# Patient Record
Sex: Male | Born: 1955 | Race: Black or African American | Hispanic: No | Marital: Married | Smoking: Never smoker
Health system: Southern US, Community
[De-identification: ages and names within clinical notes are randomized; demographics above are authoritative.]

## PROBLEM LIST (undated history)

## (undated) DIAGNOSIS — M542 Cervicalgia: Secondary | ICD-10-CM

## (undated) DIAGNOSIS — K219 Gastro-esophageal reflux disease without esophagitis: Secondary | ICD-10-CM

## (undated) DIAGNOSIS — E785 Hyperlipidemia, unspecified: Secondary | ICD-10-CM

## (undated) DIAGNOSIS — I2510 Atherosclerotic heart disease of native coronary artery without angina pectoris: Secondary | ICD-10-CM

## (undated) DIAGNOSIS — Z8669 Personal history of other diseases of the nervous system and sense organs: Secondary | ICD-10-CM

## (undated) DIAGNOSIS — IMO0001 Reserved for inherently not codable concepts without codable children: Secondary | ICD-10-CM

## (undated) DIAGNOSIS — R030 Elevated blood-pressure reading, without diagnosis of hypertension: Secondary | ICD-10-CM

## (undated) HISTORY — DX: Gastro-esophageal reflux disease without esophagitis: K21.9

## (undated) HISTORY — DX: Cervicalgia: M54.2

## (undated) HISTORY — DX: Hyperlipidemia, unspecified: E78.5

## (undated) HISTORY — DX: Reserved for inherently not codable concepts without codable children: IMO0001

## (undated) HISTORY — DX: Atherosclerotic heart disease of native coronary artery without angina pectoris: I25.10

## (undated) HISTORY — DX: Elevated blood-pressure reading, without diagnosis of hypertension: R03.0

## (undated) HISTORY — PX: EYE SURGERY: SHX253

## (undated) HISTORY — DX: Personal history of other diseases of the nervous system and sense organs: Z86.69

---

## 2011-07-02 ENCOUNTER — Encounter (HOSPITAL_COMMUNITY): Payer: Self-pay | Admitting: *Deleted

## 2011-07-02 ENCOUNTER — Emergency Department (HOSPITAL_COMMUNITY)
Admission: EM | Admit: 2011-07-02 | Discharge: 2011-07-02 | Disposition: A | Discharge status: Home / Self Care | Payer: Self-pay | Source: Home / Self Care | Attending: Emergency Medicine | Admitting: Emergency Medicine

## 2011-07-02 DIAGNOSIS — R109 Unspecified abdominal pain: Secondary | ICD-10-CM

## 2011-07-02 DIAGNOSIS — G8929 Other chronic pain: Secondary | ICD-10-CM

## 2011-07-02 LAB — POCT URINALYSIS DIP (DEVICE)
Bilirubin Urine: NEGATIVE
Ketones, ur: NEGATIVE mg/dL
Leukocytes, UA: NEGATIVE
pH: 6 (ref 5.0–8.0)

## 2011-07-02 NOTE — Discharge Instructions (Signed)
  We discussed the nature of your ongoing lower abdominal pain, many diagnoses are possible including colon cancer, prostate cancer and other malignant or benign conditions. As you've been expressing this pain for more than 2 years you've need further evaluation we are coordinating her care to see this for different specialist to screen you for at the most common reasons you might have one of his conditions. In the meantime you should also make every effort to establish [place] over her primary care Dr. your exam today does not suggest there is an emergent need for you to to go to the hospital today we are making every effort to you can see the specialist within the next 2 weeks.    Abdominal Pain Abdominal pain can be caused by many things. Your caregiver decides the seriousness of your pain by an examination and possibly blood tests and X-rays. Many cases can be observed and treated at home. Most abdominal pain is not caused by a disease and will probably improve without treatment. However, in many cases, more time must pass before a clear cause of the pain can be found. Before that point, it may not be known if you need more testing, or if hospitalization or surgery is needed. HOME CARE INSTRUCTIONS   Do not take laxatives unless directed by your caregiver.   Take pain medicine only as directed by your caregiver.   Only take over-the-counter or prescription medicines for pain, discomfort, or fever as directed by your caregiver.   Try a clear liquid diet (broth, tea, or water) for as long as directed by your caregiver. Slowly move to a bland diet as tolerated.  SEEK IMMEDIATE MEDICAL CARE IF:   The pain does not go away.   You have a fever.   You keep throwing up (vomiting).   The pain is felt only in portions of the abdomen. Pain in the right side could possibly be appendicitis. In an adult, pain in the left lower portion of the abdomen could be colitis or diverticulitis.   You pass bloody or  black tarry stools.  MAKE SURE YOU:   Understand these instructions.   Will watch your condition.   Will get help right away if you are not doing well or get worse.  Document Released: 02/03/2005 Document Revised: 01/06/2011 Document Reviewed: 12/13/2007 [facility] Patient Information 2012 [place], [place].

## 2011-07-02 NOTE — ED Provider Notes (Signed)
History     CSN: [id number]  Arrival date & time 07/02/11  1501   First MD Initiated Contact with Patient 07/02/11 1652      Chief Complaint  Patient presents with  . Abdominal Pain    (Consider location/radiation/quality/duration/timing/severity/associated sxs/prior treatment) HPI Comments: Patient presents urgent care tonight with symptomatology that goes back to 2 years and denies having had any previous evaluation and denies having any relationship with any provider.  Patient reports that for about 2 years he has noted this discomfort and pressure in his lower abdomen "bubbling sensation when I eat" (points to periumbilical and lower abdominal region bilaterally. Decided to come in as he's been feeling this more more frequently and seems that he feels his pressure from within his abdomen he points.  Patient denies any weight loss, rectal bleeding, rectal pain, but does describe that his stools have been different but not very specific about which way. Patient also denies any fevers and denies any chronic medical problems. No diarrheas and no vomiting  "it's just pressure down here"   Patient is a 56 y.o. male presenting with abdominal pain. The history is provided by the patient. The history is limited by a language barrier. No language interpreter was used.  Abdominal Pain The primary symptoms of the illness include abdominal pain. The primary symptoms of the illness do not include fever, fatigue, shortness of breath, nausea, vomiting, diarrhea or hematemesis. Episode onset: greater than 2 yeras. The onset of the illness was gradual. The problem has not changed since onset. The patient has had a change in bowel habit. Symptoms associated with the illness do not include chills, anorexia, diaphoresis, constipation, hematuria, frequency or back pain.    History reviewed. No pertinent past medical history.  History reviewed. No pertinent past surgical history.  No family history on  file.  History  Substance Use Topics  . Smoking status: Not on file  . Smokeless tobacco: Not on file  . Alcohol Use: Not on file      Review of Systems  Constitutional: Negative for fever, chills, diaphoresis, activity change, appetite change, fatigue and unexpected weight change.  Respiratory: Negative for shortness of breath.   Cardiovascular: Negative for chest pain.  Gastrointestinal: Positive for abdominal pain. Negative for nausea, vomiting, diarrhea, constipation, anorexia and hematemesis.  Genitourinary: Negative for frequency and hematuria.  Musculoskeletal: Negative for back pain.    Allergies  Review of patient's allergies indicates no known allergies.  Home Medications  No current outpatient prescriptions on file.  There were no vitals taken for this visit.  Physical Exam  Nursing note and vitals reviewed. Constitutional: He appears well-developed and well-nourished. No distress.  HENT:  Head: Normocephalic.  Eyes: Conjunctivae are normal. No scleral icterus.  Neck: Neck supple.  Abdominal: Soft. He exhibits no shifting dullness, no distension and no mass. There is no hepatosplenomegaly, splenomegaly or hepatomegaly. There is tenderness in the epigastric area and periumbilical area. There is no rigidity, no rebound, no guarding, no CVA tenderness, no tenderness at McBurney's point and negative Murphy's sign. No hernia. Hernia confirmed negative in the ventral area.      ED Course  Procedures (including critical care time)   Labs Reviewed  POCT URINALYSIS DIP (DEVICE)   No results found.   1. Chronic abdominal pain       MDM  Patient presented today to urgent care describing nonspecific and vague abdominal symptoms that including indigestion, lower abdominal discomfort, and changes in his bowel habits. Patient  patient speaks limited English, arabic origen. Exam was unremarkable. Given the patient needs as recommended by guidelines for screening  colonoscopy have decided to at this point bypass any other further evaluation and coordinate a screening colonoscopy as well as to discuss potential benefits and risks of having his prostate exam and checked both referrals were originated today as patient has no primary care Dr. I discussed with him that his abdomen did not resemble an acute abdominal condition that require any immediate evaluation or transfer to the emergency department. Patient agreed with plan of care and will also make an effort to establish her primary care doctor in the near future I discussed with him that there are many types of cancer they can manifest with abdominal discomfort including colon cancer and prostate cancer.          Jimmie Molly, MD 07/02/11 [phone]

## 2011-07-02 NOTE — ED Notes (Signed)
Pt    Reports    abd  Pain    With  Sensation  Of       Bubbling     Indigestion      X  2  Years  He  States -  Getting  Worse     -  Pt  Appears in no  Severe  diostess  Animated  And  Walking  With upright fluid  Gait  denys  Any  Vomiting or  Diarrhea  denys  Any  Constipation       Skin is  Warm  Dry -  He  States  In past he  Has  Taken zantac  And  maalox   For  The  Symptoms

## 2011-07-06 NOTE — ED Notes (Signed)
2/22  Medical follow-up request form received from Dr. Ladon Applebaum for pt. to Dr. Elnoria Howard for screening for colonoscopy. #2 Referral to Dr. Annabell Howells for HPP vs Prostate cancer. 2/26 Pt. called this AM and asked about his referrals. I told him I have not worked on them yet but I would call him.      I called and left a message with Morrie Sheldon at [facility]. I called Dr. Haywood Pao office and scheduled appt. for pt. 3/11 @ 1400.  Pt. needs to arrive 15 min. early and call the office before his appt. I called pt. And left a message with appointment information. I told pt. to call if any questions. Chart faxed to [phone] and confirmation received. [name] 07/06/2011

## 2011-07-07 ENCOUNTER — Telehealth (HOSPITAL_COMMUNITY): Payer: Self-pay | Admitting: *Deleted

## 2011-07-07 NOTE — ED Notes (Signed)
2/26 I called pt. and gave pt. appointment information. He said he can't go then because he works 2nd shift. I told him he needs to call the office anyway before his appt. and he can change it when he calls. He said he can't do that and needs me to do that. I told him I would call tomorrow. 2/27 I rescheduled pt.'s appt. for 3/11 @ 1015. I called patient and left this on his VM and told him to call me if any questions. I also called [facility] and he is not one of their patients. I asked if he has the orange card and needs a referral to a specialist who do I call. She said to call [facility] @ [phone] and ask for his case worker. I called [facility] and left message to call me. [name] 07/07/2011

## 2011-07-07 NOTE — ED Notes (Signed)
2/26 @ 1458  Morrie Sheldon from [facility] called on VM and said patients with the orange card have to go through [facility] or [facility]. [name] 07/07/2011

## 2011-07-09 ENCOUNTER — Telehealth (HOSPITAL_COMMUNITY): Payer: Self-pay | Admitting: *Deleted

## 2011-07-09 NOTE — ED Notes (Signed)
2/28  1215 Pt. called back but no message. I called and left a message at [facility] @ [phone].  3/1 Judeth Cornfield called back and said she will send me a referral form. She told me to fill it out and fax it back with his record and she will do the referral to [facility]. She said there is a waiting list and it may be 2-3 mos. before he can be seen. Form filled out and faxed  to [phone] and confirmation recieved. I called pt. back and made sure he understood appt. information for Dr. Elnoria Howard on 3/11. I told him that the Urology appt. is being worked on by [employer] [facility] Care. He said he is going to see Diane on Mon. and she is going to change his PCP. I told him to ask about his Urology referral while he is there. [name] 07/09/2011

## 2011-07-16 ENCOUNTER — Telehealth (HOSPITAL_COMMUNITY): Payer: Self-pay | Admitting: *Deleted

## 2011-07-16 NOTE — ED Notes (Signed)
Pt. called on VM on 3/5 and 3/8 asking about his 2nd appointment with Dr. Annabell Howells at [facility]. I called pt. back and told him I had turned it over to Lattie Corns at [facility] and she was going to get it scheduled. I told him I had called her today and left a message and would call him when I heard about his appt. I told him it might be awhile before he can be seen. I reminded him about his appt. Mon. 3/11 with Dr. Elnoria Howard. Pt. voiced understanding. [name] 07/16/2011

## 2011-07-16 NOTE — ED Notes (Signed)
Stephanie at [facility] called back on my VM and said pt. has been wait-listed. There are 10 referrals ahead of him. She can only do 3-4/month. She can send him to [facility] if he needs to go sooner. Just let her know and she will send the paperwork. [name] 07/16/2011

## 2011-09-02 ENCOUNTER — Emergency Department (HOSPITAL_COMMUNITY)
Admission: EM | Admit: 2011-09-02 | Discharge: 2011-09-02 | Disposition: A | Discharge status: Home / Self Care | Payer: Self-pay | Attending: Emergency Medicine | Admitting: Emergency Medicine

## 2011-09-02 ENCOUNTER — Telehealth: Payer: Self-pay

## 2011-09-02 ENCOUNTER — Encounter (HOSPITAL_COMMUNITY): Payer: Self-pay | Admitting: Radiology

## 2011-09-02 ENCOUNTER — Emergency Department (HOSPITAL_COMMUNITY): Payer: Self-pay

## 2011-09-02 DIAGNOSIS — R1110 Vomiting, unspecified: Secondary | ICD-10-CM | POA: Insufficient documentation

## 2011-09-02 DIAGNOSIS — K5289 Other specified noninfective gastroenteritis and colitis: Secondary | ICD-10-CM | POA: Insufficient documentation

## 2011-09-02 DIAGNOSIS — R197 Diarrhea, unspecified: Secondary | ICD-10-CM | POA: Insufficient documentation

## 2011-09-02 DIAGNOSIS — R079 Chest pain, unspecified: Secondary | ICD-10-CM | POA: Insufficient documentation

## 2011-09-02 DIAGNOSIS — K529 Noninfective gastroenteritis and colitis, unspecified: Secondary | ICD-10-CM

## 2011-09-02 LAB — CBC
HCT: 48.7 % (ref 39.0–52.0)
Hemoglobin: 16.8 g/dL (ref 13.0–17.0)
MCHC: 34.5 g/dL (ref 30.0–36.0)
RBC: 5.5 MIL/uL (ref 4.22–5.81)
WBC: 4.4 10*3/uL (ref 4.0–10.5)

## 2011-09-02 LAB — COMPREHENSIVE METABOLIC PANEL
Alkaline Phosphatase: 92 U/L (ref 39–117)
BUN: 15 mg/dL (ref 6–23)
CO2: 26 mEq/L (ref 19–32)
Chloride: 102 mEq/L (ref 96–112)
Creatinine, Ser: 0.8 mg/dL (ref 0.50–1.35)
GFR calc non Af Amer: >90 mL/min (ref >90)
Total Bilirubin: 1.3 mg/dL — ABNORMAL HIGH (ref 0.3–1.2)

## 2011-09-02 LAB — URINALYSIS, ROUTINE W REFLEX MICROSCOPIC
Hgb urine dipstick: NEGATIVE
Ketones, ur: NEGATIVE mg/dL
Leukocytes, UA: NEGATIVE
Protein, ur: NEGATIVE mg/dL
Urobilinogen, UA: 0.2 mg/dL (ref 0.0–1.0)

## 2011-09-02 LAB — POCT I-STAT TROPONIN I: Troponin i, poc: 0 ng/mL (ref 0.00–0.08)

## 2011-09-02 LAB — DIFFERENTIAL
Lymphocytes Relative: 17 % (ref 12–46)
Lymphs Abs: 0.8 10*3/uL (ref 0.7–4.0)
Monocytes Absolute: 0.4 10*3/uL (ref 0.1–1.0)
Monocytes Relative: 8 % (ref 3–12)
Neutro Abs: 3.2 10*3/uL (ref 1.7–7.7)

## 2011-09-02 MED ORDER — IOHEXOL 300 MG/ML  SOLN
20.0000 mL | INTRAMUSCULAR | Status: AC
  Administered 2011-09-02 (×2): 20 mL via ORAL

## 2011-09-02 MED ORDER — GI COCKTAIL ~~LOC~~
30.0000 mL | Freq: Once | ORAL | Status: AC
  Administered 2011-09-02: 30 mL via ORAL
  Filled 2011-09-02: qty 30

## 2011-09-02 MED ORDER — DICYCLOMINE HCL 20 MG PO TABS: 20.0000 mg | ORAL_TABLET | Freq: Two times a day (BID) | ORAL | Status: DC

## 2011-09-02 MED ORDER — SODIUM CHLORIDE 0.9 % IV BOLUS (SEPSIS)
500.0000 mL | Freq: Once | INTRAVENOUS | Status: AC
  Administered 2011-09-02: 500 mL via INTRAVENOUS

## 2011-09-02 MED ORDER — IOHEXOL 300 MG/ML  SOLN
85.0000 mL | Freq: Once | INTRAMUSCULAR | Status: AC | PRN
  Administered 2011-09-02: 85 mL via INTRAVENOUS

## 2011-09-02 NOTE — ED Provider Notes (Signed)
Medical screening examination/treatment/procedure(s) were performed by non-physician practitioner and as supervising physician I was immediately available for consultation/collaboration.   Glynn Octave, MD 09/02/11 [phone]

## 2011-09-02 NOTE — ED Notes (Signed)
Patient completed oral contrast notified CT.  Patient pain currently is 5/10 achy middle lower abdomen.

## 2011-09-02 NOTE — Discharge Instructions (Signed)
B.R.A.T. Diet Your doctor has recommended the B.R.A.T. diet for you or your child until the condition improves. This is often used to help control diarrhea and vomiting symptoms. If you or your child can tolerate clear liquids, you may have:  Bananas.   Rice.   Applesauce.   Toast (and other simple starches such as crackers, potatoes, noodles).  Be sure to avoid dairy products, meats, and fatty foods until symptoms are better. Fruit juices such as apple, grape, and prune juice can make diarrhea worse. Avoid these. Continue this diet for 2 days or as instructed by your caregiver. Document Released: 04/26/2005 Document Revised: 04/15/2011 Document Reviewed: 10/13/2006 [facility] Patient Information 2012 [place], [place].Colitis Colitis is inflammation of the colon. Colitis can be a short-term or long-standing (chronic) illness. Crohn's disease and ulcerative colitis are 2 types of colitis which are chronic. They usually require lifelong treatment. CAUSES  There are many different causes of colitis, including:  Viruses.   Germs (bacteria).   Medicine reactions.  SYMPTOMS   Diarrhea.   Intestinal bleeding.   Pain.   Fever.   Throwing up (vomiting).   Tiredness (fatigue).   Weight loss.   Bowel blockage.  DIAGNOSIS  The diagnosis of colitis is based on examination and stool or blood tests. X-rays, CT scan, and colonoscopy may also be needed. TREATMENT  Treatment may include:  Fluids given through the vein (intravenously).   Bowel rest (nothing to eat or drink for a period of time).   Medicine for pain and diarrhea.   Medicines (antibiotics) that kill germs.   Cortisone medicines.   Surgery.  HOME CARE INSTRUCTIONS   Get plenty of rest.   Drink enough water and fluids to keep your urine clear or pale yellow.   Eat a well-balanced diet.   Call your caregiver for follow-up as recommended.  SEEK IMMEDIATE MEDICAL CARE IF:   You develop chills.   You have an  oral temperature above 102 F (38.9 C), not controlled by medicine.   You have extreme weakness, fainting, or dehydration.   You have repeated vomiting.   You develop severe belly (abdominal) pain or are passing bloody or tarry stools.  MAKE SURE YOU:   Understand these instructions.   Will watch your condition.   Will get help right away if you are not doing well or get worse.  Document Released: 06/03/2004 Document Revised: 04/15/2011 Document Reviewed: 08/29/2009 [facility] Patient Information 2012 [place], [place].Colitis Colitis is inflammation of the colon. Colitis can be a short-term or long-standing (chronic) illness. Crohn's disease and ulcerative colitis are 2 types of colitis which are chronic. They usually require lifelong treatment. CAUSES  There are many different causes of colitis, including:  Viruses.   Germs (bacteria).   Medicine reactions.  SYMPTOMS   Diarrhea.   Intestinal bleeding.   Pain.   Fever.   Throwing up (vomiting).   Tiredness (fatigue).   Weight loss.   Bowel blockage.  DIAGNOSIS  The diagnosis of colitis is based on examination and stool or blood tests. X-rays, CT scan, and colonoscopy may also be needed. TREATMENT  Treatment may include:  Fluids given through the vein (intravenously).   Bowel rest (nothing to eat or drink for a period of time).   Medicine for pain and diarrhea.   Medicines (antibiotics) that kill germs.   Cortisone medicines.   Surgery.  HOME CARE INSTRUCTIONS   Get plenty of rest.   Drink enough water and fluids to keep your urine clear or  pale yellow.   Eat a well-balanced diet.   Call your caregiver for follow-up as recommended.  SEEK IMMEDIATE MEDICAL CARE IF:   You develop chills.   You have an oral temperature above 102 F (38.9 C), not controlled by medicine.   You have extreme weakness, fainting, or dehydration.   You have repeated vomiting.   You develop severe belly (abdominal) pain  or are passing bloody or tarry stools.  MAKE SURE YOU:   Understand these instructions.   Will watch your condition.   Will get help right away if you are not doing well or get worse.  Document Released: 06/03/2004 Document Revised: 04/15/2011 Document Reviewed: 08/29/2009 [facility] Patient Information 2012 [place], [place].

## 2011-09-02 NOTE — ED Provider Notes (Signed)
6:42 PM Pt signed out to me by Remi Haggard NP. Imaging indicates inflammatory changes of the terminal ileum and potential inflammatory bowel disease. This is a possibility since patient reports having lower abdominal pain for years. Potentially he could be having a flareup. Denies prior colonoscopies. Will discharge with pain medication and followup with gastroenterology for colonoscopy. Discussed this with patient who voices understanding. Patient does not have a white count or fever therefore do not feel patient would benefit from antibiotics at this time. Abdomen reexamined. Mild suprapubic tenderness. No nausea or vomiting in ED.  Results for orders placed during the hospital encounter of 09/02/11  CBC      Component Value Range   WBC 4.4  4.0 - 10.5 (K/uL)   RBC 5.50  4.22 - 5.81 (MIL/uL)   Hemoglobin 16.8  13.0 - 17.0 (g/dL)   HCT 16.1  09.6 - 04.5 (%)   MCV 88.5  78.0 - 100.0 (fL)   MCH 30.5  26.0 - 34.0 (pg)   MCHC 34.5  30.0 - 36.0 (g/dL)   RDW 40.9  81.1 - 91.4 (%)   Platelets 150  150 - 400 (K/uL)  DIFFERENTIAL      Component Value Range   Neutrophils Relative 71  43 - 77 (%)   Neutro Abs 3.2  1.7 - 7.7 (K/uL)   Lymphocytes Relative 17  12 - 46 (%)   Lymphs Abs 0.8  0.7 - 4.0 (K/uL)   Monocytes Relative 8  3 - 12 (%)   Monocytes Absolute 0.4  0.1 - 1.0 (K/uL)   Eosinophils Relative 3  0 - 5 (%)   Eosinophils Absolute 0.1  0.0 - 0.7 (K/uL)   Basophils Relative 0  0 - 1 (%)   Basophils Absolute 0.0  0.0 - 0.1 (K/uL)  COMPREHENSIVE METABOLIC PANEL      Component Value Range   Sodium 139  135 - 145 (mEq/L)   Potassium 3.8  3.5 - 5.1 (mEq/L)   Chloride 102  96 - 112 (mEq/L)   CO2 26  19 - 32 (mEq/L)   Glucose, Bld 92  70 - 99 (mg/dL)   BUN 15  6 - 23 (mg/dL)   Creatinine, Ser 7.82  0.50 - 1.35 (mg/dL)   Calcium 9.5  8.4 - 95.6 (mg/dL)   Total Protein 7.7  6.0 - 8.3 (g/dL)   Albumin 4.3  3.5 - 5.2 (g/dL)   AST 26  0 - 37 (U/L)   ALT 25  0 - 53 (U/L)   Alkaline  Phosphatase 92  39 - 117 (U/L)   Total Bilirubin 1.3 (*) 0.3 - 1.2 (mg/dL)   GFR calc non Af Amer >90  >90 (mL/min)   GFR calc Af Amer >90  >90 (mL/min)  URINALYSIS, ROUTINE W REFLEX MICROSCOPIC      Component Value Range   Color, Urine YELLOW  YELLOW    APPearance CLEAR  CLEAR    Specific Gravity, Urine 1.015  1.005 - 1.030    pH 5.0  5.0 - 8.0    Glucose, UA NEGATIVE  NEGATIVE (mg/dL)   Hgb urine dipstick NEGATIVE  NEGATIVE    Bilirubin Urine NEGATIVE  NEGATIVE    Ketones, ur NEGATIVE  NEGATIVE (mg/dL)   Protein, ur NEGATIVE  NEGATIVE (mg/dL)   Urobilinogen, UA 0.2  0.0 - 1.0 (mg/dL)   Nitrite NEGATIVE  NEGATIVE    Leukocytes, UA NEGATIVE  NEGATIVE   POCT I-STAT TROPONIN I      Component  Value Range   Troponin i, poc 0.00  0.00 - 0.08 (ng/mL)   Comment 3            Ct Abdomen Pelvis W Contrast  09/02/2011  *RADIOLOGY REPORT*  Clinical Data: Abdominal pain  CT ABDOMEN AND PELVIS WITH CONTRAST  Technique:  Multidetector CT imaging of the abdomen and pelvis was performed following the standard protocol during bolus administration of intravenous contrast.  Contrast: 85mL OMNIPAQUE IOHEXOL 300 MG/ML  SOLN  Comparison: None.  Findings: Normal appendix.  There is suspected to be a mild wall thickening the terminal ileum.  No extraluminal bowel gas.  No abscess formation.  Slight stranding of the fat adjacent to the terminal ileum is present.  Liver, gallbladder, spleen, pancreas, adrenal glands, kidneys are within normal limits.  Bladder is distended.  No free fluid.  No abnormal adenopathy.  IMPRESSION: No evidence of appendicitis.  Inflammatory changes of the terminal ileum.  Consider infection, inflammatory bowel disease.  Original Report Authenticated By: Donavan Burnet, M.D.      Thomasene Lot, PA-C 09/02/11 1845

## 2011-09-02 NOTE — ED Provider Notes (Signed)
History     CSN: [id number]  Arrival date & time 09/02/11  1220   First MD Initiated Contact with Patient 09/02/11 1338      Chief Complaint  Patient presents with  . Abdominal Pain  . Chest Pain    (Consider location/radiation/quality/duration/timing/severity/associated sxs/prior treatment) Patient is a 55 y.o. male presenting with abdominal pain. The history is provided by the patient and the spouse. No language interpreter was used.  Abdominal Pain The primary symptoms of the illness include abdominal pain, vomiting and diarrhea. The primary symptoms of the illness do not include fever, shortness of breath, nausea or dysuria. The current episode started more than 2 days ago. The problem has been gradually worsening.  Symptoms associated with the illness do not include chills, heartburn, constipation, hematuria or back pain. Significant associated medical issues include GERD. Significant associated medical issues do not include inflammatory bowel disease, diabetes or substance abuse.   56 year old male coming in with complaint of abdominal pain in the epigastric area x15 years. Also complains of suprapubic pain x3 years. [facility] which confirms that he has had this pain for a long period of time and was treated with Flagyl and Nexium. Patient states that he does not take the Nexium anymore. States that he did have 4 bouts of diarrhea through the night which made the.the abdominal pain worse. Denies burning with urination denies blood in his urine. Denies testicular or scrotal pain. Notes from [place] showed a restrictive for H. pylori. Pt hasnever seen a GI doctor. Patient does not look toxic.  No past medical history on file.  No past surgical history on file.  No family history on file.  History  Substance Use Topics  . Smoking status: Not on file  . Smokeless tobacco: Not on file  . Alcohol Use: Not on file      Review of Systems    Constitutional: Negative.  Negative for fever and chills.  HENT: Negative.   Eyes: Negative.   Respiratory: Negative.  Negative for chest tightness and shortness of breath.   Cardiovascular: Negative.  Negative for chest pain.  Gastrointestinal: Positive for vomiting, abdominal pain and diarrhea. Negative for heartburn, nausea, constipation, blood in stool, abdominal distention, anal bleeding and rectal pain.  Genitourinary: Negative for dysuria, hematuria, discharge, scrotal swelling and testicular pain.  Musculoskeletal: Negative for back pain.  Neurological: Negative.  Negative for dizziness.  Psychiatric/Behavioral: Negative.   All other systems reviewed and are negative.    Allergies  Review of patient's allergies indicates no known allergies.  Home Medications   Current Outpatient Rx  Name Route Sig Dispense Refill  . IBUPROFEN 200 MG PO TABS Oral Take 200-400 mg by mouth every 6 (six) hours as needed. As needed for occasional headaches.    Marland Kitchen OVER THE COUNTER MEDICATION Oral Take 1 tablet by mouth at bedtime. Allergy medication for runny nose and itching.      BP 132/77  Pulse 83  Temp(Src) 97.3 F (36.3 C) (Oral)  Resp 18  SpO2 98%  Physical Exam  Nursing note and vitals reviewed. Constitutional: He is oriented to person, place, and time. He appears well-developed and well-nourished.  HENT:  Head: Normocephalic.  Eyes: Conjunctivae and EOM are normal. Pupils are equal, round, and reactive to light.  Neck: Normal range of motion. Neck supple.  Cardiovascular: Normal rate.   Pulmonary/Chest: Effort normal.  Abdominal: Soft. Bowel sounds are normal. He exhibits no mass. There is  tenderness. There is no rebound and no guarding.       Suprapubic tenderness and epigastric tenderness  Musculoskeletal: Normal range of motion.  Neurological: He is alert and oriented to person, place, and time.  Skin: Skin is warm and dry.  Psychiatric: He has a normal mood and affect.     ED Course  Procedures (including critical care time)   Labs Reviewed  CBC  DIFFERENTIAL  COMPREHENSIVE METABOLIC PANEL  URINALYSIS, ROUTINE W REFLEX MICROSCOPIC   No results found.   No diagnosis found.    MDM  The patient will be admitted to CDU awaiting his CT of the abdomen with contrast. CBC seamen and troponin are negative. EKG unremarkable. Report given to [facility] on the CDU unit. CT of the abdomen is negative patient will go home on H2 blocker with GI follow up.           Remi Haggard, NP 09/02/11 1538

## 2011-09-02 NOTE — ED Notes (Signed)
Pt c/o suprapubic abdominal pain that started last night, vague description fo pain, states bowel and bladder have been normal. Radiates to his chest. He c/o chest pain for 15 years but the abd pain makes it worse. Denies radiation, SOB, N/V or diaphoresis

## 2011-09-02 NOTE — Telephone Encounter (Signed)
Printed out message and put in patient's chart because patient only has paper chart.

## 2011-09-02 NOTE — Telephone Encounter (Signed)
Chris Young FROM [place] WITH DR Marylen Ponto OFFICE STATES THEY NEED RECORDS FAXED ON PT. PLEASE FAX TO [phone] AND THE PHONE NUMBER IS [phone]

## 2011-09-02 NOTE — ED Notes (Signed)
Patient states abdominal pain points to middle lower abdomen radiating to epigastric.  Abdomen soft slight distended. Airway intact bilateral equal chest rise and fall.  States pain currently 8/10 "it hurts too much"  Resting comfortably on stretcher. Family member at bedside.

## 2011-09-07 NOTE — ED Provider Notes (Signed)
Medical screening examination/treatment/procedure(s) were performed by non-physician practitioner and as supervising physician I was immediately available for consultation/collaboration.  Raeford Razor, MD 09/07/11 1246

## 2011-09-30 ENCOUNTER — Telehealth (HOSPITAL_COMMUNITY): Payer: Self-pay | Admitting: *Deleted

## 2011-09-30 NOTE — ED Notes (Signed)
Fax received from [facility] saying pt does not need to see the urologist unless PSA rises or if rectal exam is abnormal. 5/23 Dr. Ladon Applebaum notified. He said to call pt. and tell him to f/u with his PCP. Chris Young 09/30/2011

## 2011-10-14 ENCOUNTER — Encounter (HOSPITAL_COMMUNITY): Payer: Self-pay | Admitting: Emergency Medicine

## 2011-10-14 ENCOUNTER — Emergency Department (HOSPITAL_COMMUNITY)
Admission: EM | Admit: 2011-10-14 | Discharge: 2011-10-14 | Disposition: A | Discharge status: Home / Self Care | Payer: Self-pay | Source: Home / Self Care

## 2011-10-14 DIAGNOSIS — H269 Unspecified cataract: Secondary | ICD-10-CM

## 2011-10-14 NOTE — Discharge Instructions (Signed)
Thank you for coming in today. YOu have cataracts.  Please follow up with (eye doctor) opthalmology as needed.  Your primary doctor may be able to help you.

## 2011-10-14 NOTE — ED Notes (Signed)
PT HERE WITH RIGHT EYE PRESSURE AND TEMPORAL DISCOMFORT SINCE 1988.STATES HE WAS TOLD X 5 YRS AGO POSS CATARACT AND SENT TO EYE DOCTOR.PT GIVEN BIFOCAL GLASSES BUT STATES PRESSURE IS GETTING WORSE OVER TIME BUT DENIES EYE FLOATERS  OR BLURRY VISION

## 2011-10-14 NOTE — ED Provider Notes (Signed)
Chris Young is a 56 y.o. male who presents to Urgent Care today for decreased vision.  Patient is from the [place].  He notes that since 1988 he has decreased vision.  8 years ago he saw an optometrist who diagnosed him with cataracts.  He notes that his vision has decreased again and he would like treatment at all possible.  He denies any acute change in vision pain fevers or chills.    PMH reviewed. Significant for multiple myeloma diabetes hypertension and renal insufficiency History  Substance Use Topics  . Smoking status: Never Smoker   . Smokeless tobacco: Not on file  . Alcohol Use: No   ROS as above Medications reviewed. No current facility-administered medications for this encounter.   Current Outpatient Prescriptions  Medication Sig Dispense Refill  . dicyclomine (BENTYL) 20 MG tablet Take 1 tablet (20 mg total) by mouth 2 (two) times daily.  40 tablet  0  . ibuprofen (ADVIL,MOTRIN) 200 MG tablet Take 200-400 mg by mouth every 6 (six) hours as needed. As needed for occasional headaches.      Marland Kitchen OVER THE COUNTER MEDICATION Take 1 tablet by mouth at bedtime. Allergy medication for runny nose and itching.        Exam:  BP 142/86  Pulse 60  Temp(Src) 98.3 F (36.8 C) (Oral)  Resp 16  SpO2 98% Gen: Well NAD HEENT: EOMI,  MMM, difficult to appreciate fundus do to glare bilaterally. Pupils are equal round reactive to light.  Lungs: CTABL Nl WOB Heart: RRR no MRG Exts: Non edematous BL  LE, warm and well perfused.   No results found for this or any previous visit (from the past 24 hour(s)). No results found.  Assessment and Plan: 56 y.o. male with decreased vision secondary to cataracts.  Reassured patient. Referred to ophthalmology.     Rodolph Bong, MD 10/14/11 [phone]

## 2011-10-14 NOTE — ED Provider Notes (Signed)
Medical screening examination/treatment/procedure(s) were performed by non-physician practitioner and as supervising physician I was immediately available for consultation/collaboration.  Raynald Blend, MD 10/14/11 1753

## 2011-10-26 ENCOUNTER — Telehealth (HOSPITAL_COMMUNITY): Payer: Self-pay | Admitting: *Deleted

## 2011-10-26 NOTE — ED Notes (Signed)
Pt. called back and I told him what the Urology office said and that Dr. Ladon Applebaum recommends he get a PCP.  Pt.'s English is limited, but he said he went to the ED and they checked him all over. They referred him to Dr. Luciano Cutter.  He saw him and was put on a pill that is helping.  He has a f/u appointment on 7/5. I told him Dr. Luciano Cutter is a specialist for the stomach.  I told him I was glad he finally got in to see the gastroenterologist but he needs a primary care doctor- he called it a "general doctor" and I said yes. He said he has to renew his orange card. I told him to call me back after he gets a PCP and I will call the office and explain what he needs done. Chris Young 10/26/2011

## 2013-06-22 IMAGING — CT CT ABD-PELV W/ CM
2 of 6 series · 17 of 46 positions shown, 19 images · IV contrast (APPLIED)
Comparison: None.

CLINICAL DATA: Abdominal pain

CT ABDOMEN AND PELVIS WITH CONTRAST
TECHNIQUE: Multidetector CT imaging of the abdomen and pelvis was
performed following the standard protocol during bolus
administration of intravenous contrast.
Contrast: 85mL OMNIPAQUE IOHEXOL 300 MG/ML  SOLN

[Series 2: abd/pelv with 5.0 b31f st · axial · 0.70mm/px · z∈[+940,+1296]mm · 14 of 81 slices shown, 16 images]
[im 5/81  soft-tissue]
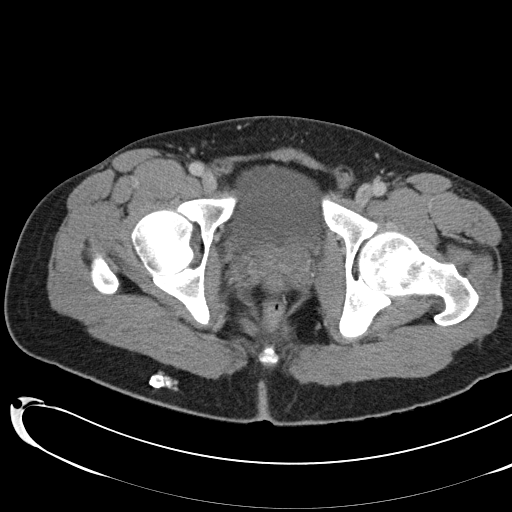
[im 5/81  bone]
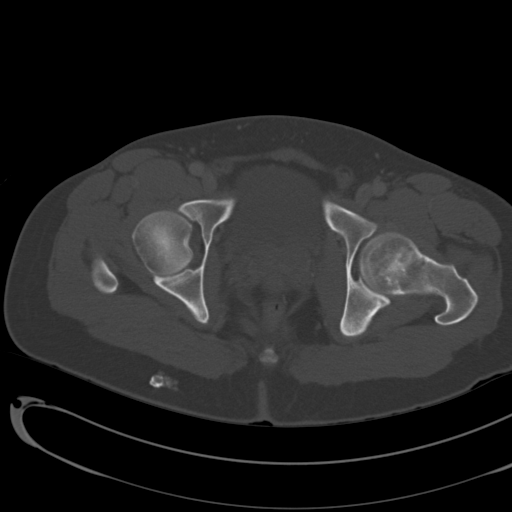
[im 10/81  soft-tissue]
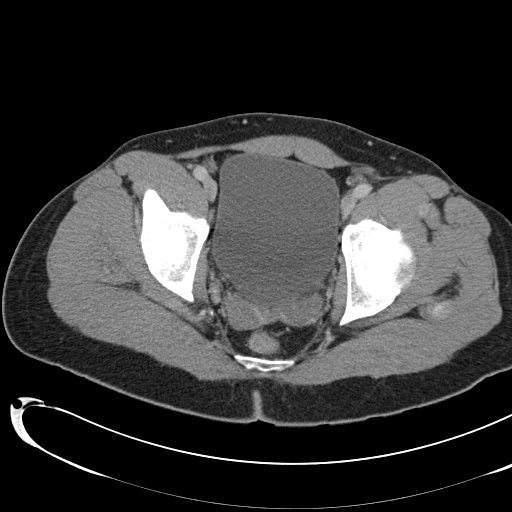
[im 15/81  soft-tissue]
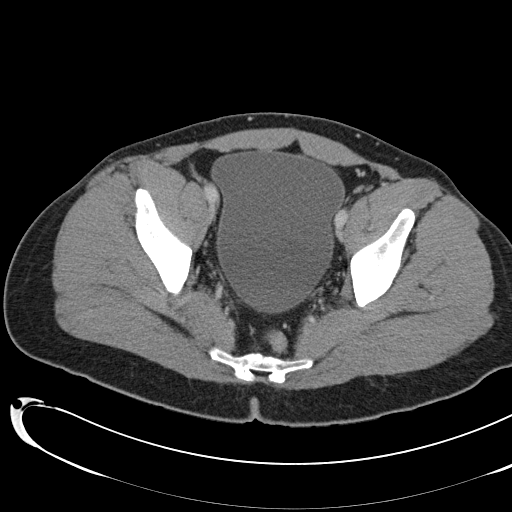
[im 24/81  soft-tissue]
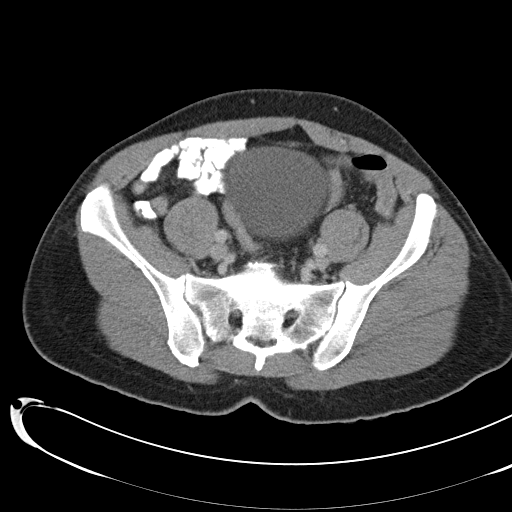
[im 29/81  soft-tissue]
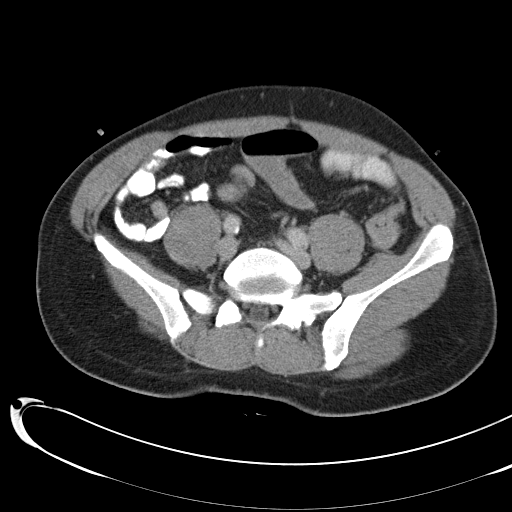
[im 33/81  soft-tissue]
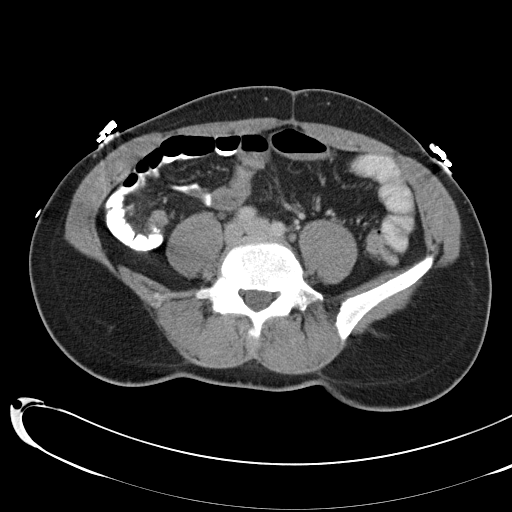
[im 38/81  soft-tissue]
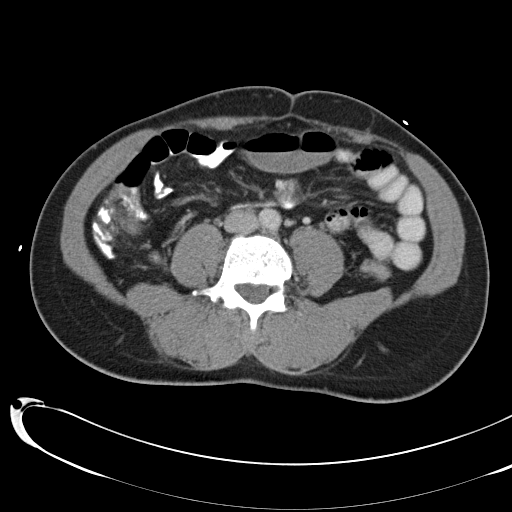
[im 43/81  soft-tissue]
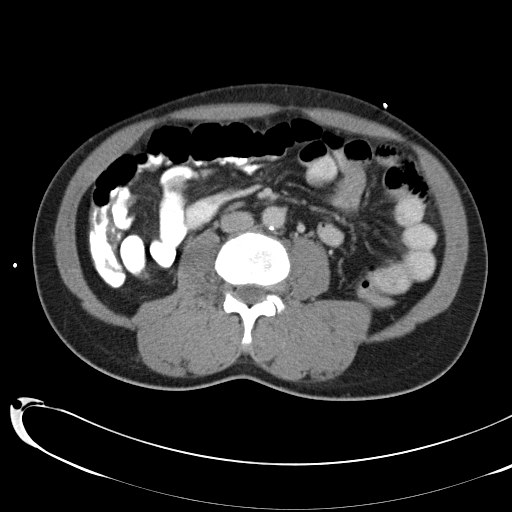
[im 48/81  soft-tissue]
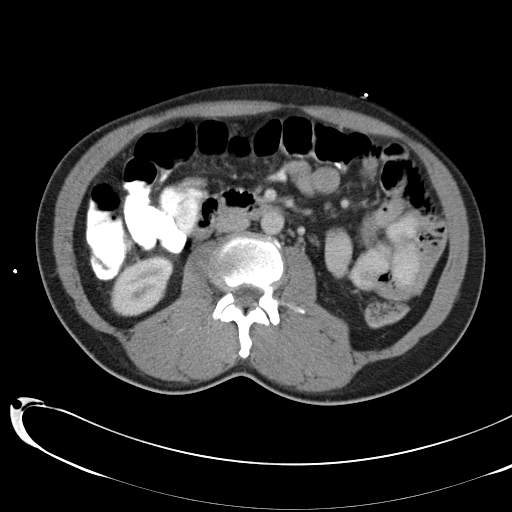
[im 48/81  bone]
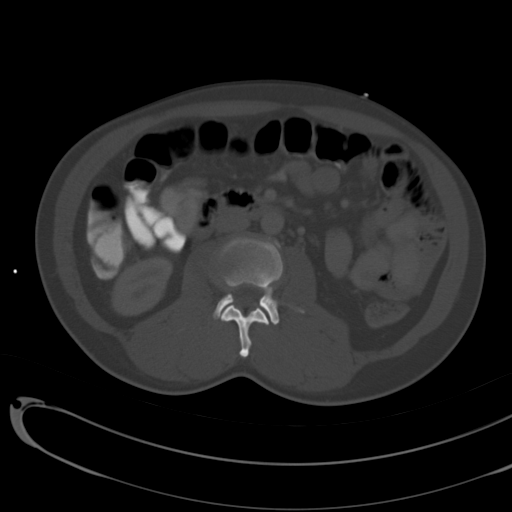
[im 52/81  soft-tissue]
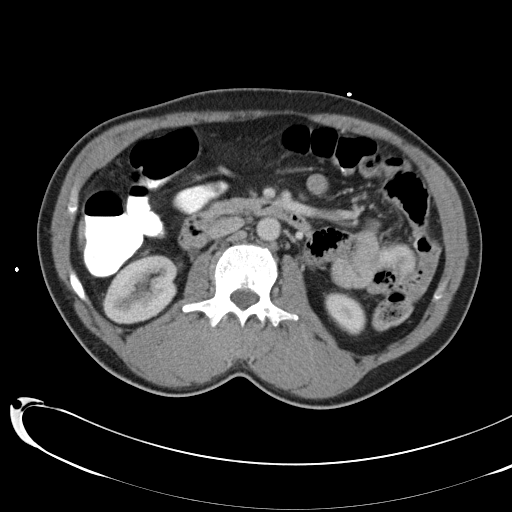
[im 62/81  soft-tissue]
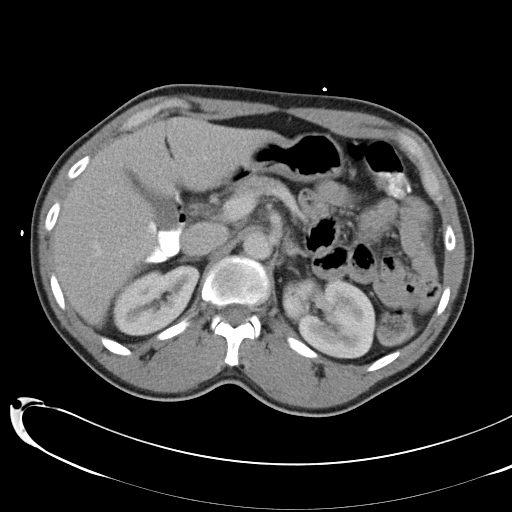
[im 66/81  soft-tissue]
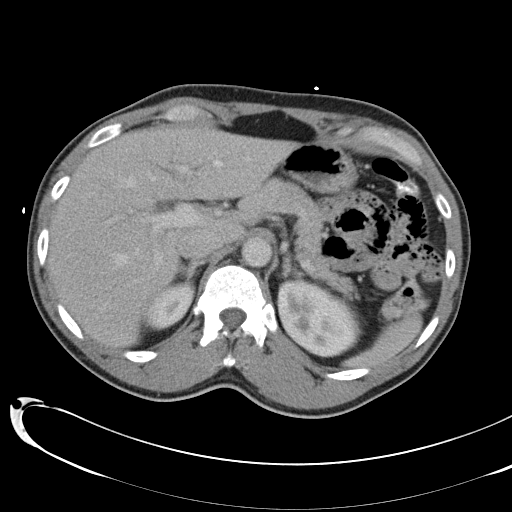
[im 71/81  soft-tissue]
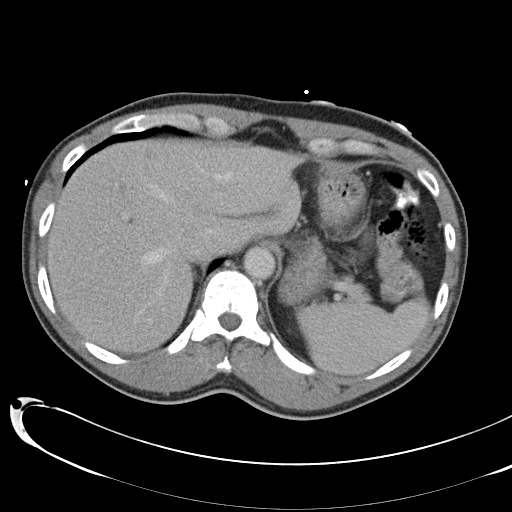
[im 76/81  soft-tissue]
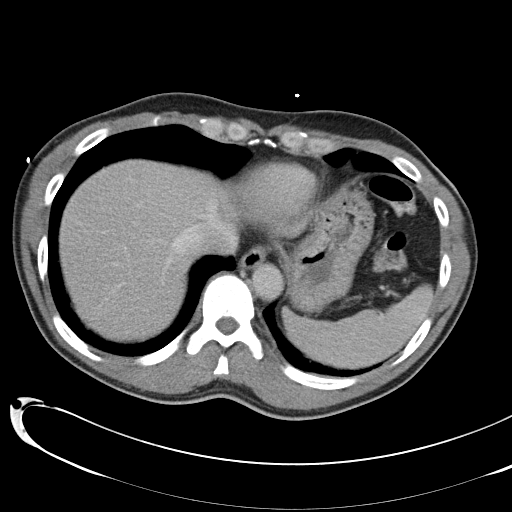

[Series 6: coronals · coronal · 0.90mm/px · 3 of 98 slices shown]
[im 33/98  soft-tissue]
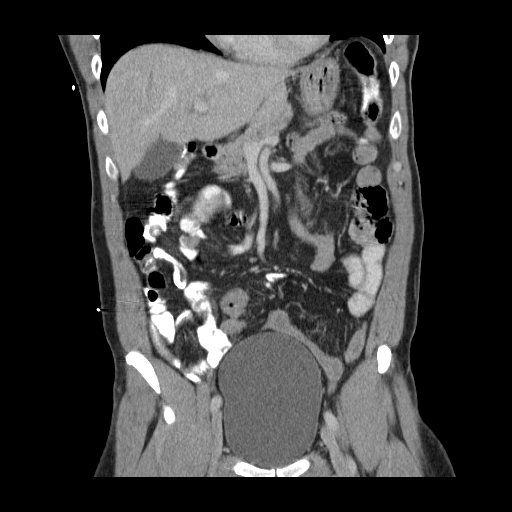
[im 44/98  soft-tissue]
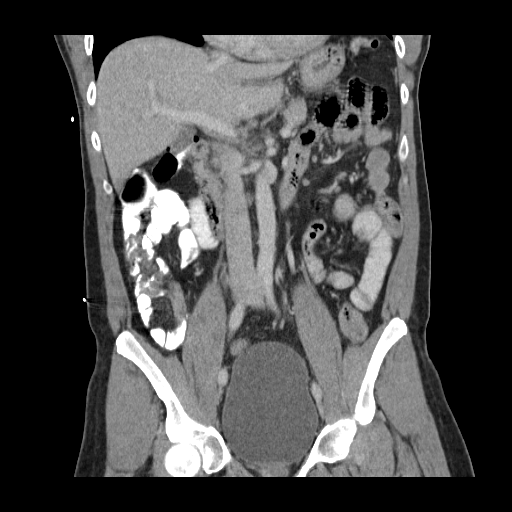
[im 54/98  soft-tissue]
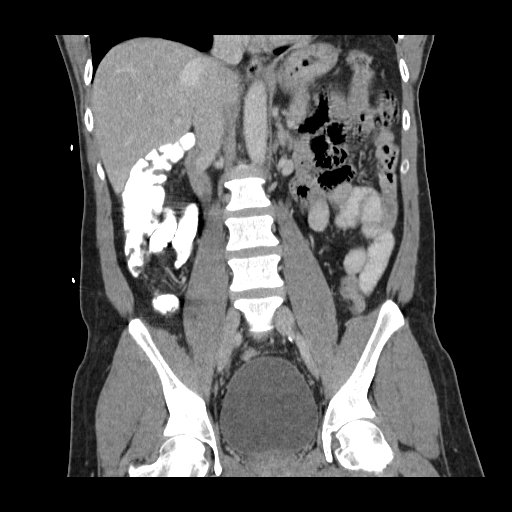

[17 of 46 positions shown; findings below may reference images not displayed]

FINDINGS: Normal appendix.  There is suspected to be a mild wall
thickening the terminal ileum.  No extraluminal bowel gas.  No
abscess formation.  Slight stranding of the fat adjacent to the
terminal ileum is present.

Liver, gallbladder, spleen, pancreas, adrenal glands, kidneys are
within normal limits.

Bladder is distended.  No free fluid.  No abnormal adenopathy.
IMPRESSION: No evidence of appendicitis.

Inflammatory changes of the terminal ileum.  Consider infection,
inflammatory bowel disease.

## 2014-07-18 ENCOUNTER — Encounter (HOSPITAL_COMMUNITY): Payer: Self-pay | Admitting: Physical Medicine and Rehabilitation

## 2014-07-18 ENCOUNTER — Emergency Department (HOSPITAL_COMMUNITY)
Admission: EM | Admit: 2014-07-18 | Discharge: 2014-07-18 | Disposition: A | Discharge status: Home / Self Care | Payer: 59 | Attending: Emergency Medicine | Admitting: Emergency Medicine

## 2014-07-18 ENCOUNTER — Emergency Department (HOSPITAL_COMMUNITY): Payer: 59

## 2014-07-18 DIAGNOSIS — R05 Cough: Secondary | ICD-10-CM | POA: Diagnosis not present

## 2014-07-18 DIAGNOSIS — M79641 Pain in right hand: Secondary | ICD-10-CM

## 2014-07-18 DIAGNOSIS — G8929 Other chronic pain: Secondary | ICD-10-CM

## 2014-07-18 DIAGNOSIS — Z79899 Other long term (current) drug therapy: Secondary | ICD-10-CM | POA: Diagnosis not present

## 2014-07-18 DIAGNOSIS — J029 Acute pharyngitis, unspecified: Secondary | ICD-10-CM | POA: Diagnosis present

## 2014-07-18 DIAGNOSIS — M542 Cervicalgia: Secondary | ICD-10-CM | POA: Diagnosis not present

## 2014-07-18 DIAGNOSIS — R059 Cough, unspecified: Secondary | ICD-10-CM

## 2014-07-18 MED ORDER — GI COCKTAIL ~~LOC~~
30.0000 mL | Freq: Once | ORAL | Status: AC
  Administered 2014-07-18: 30 mL via ORAL
  Filled 2014-07-18: qty 30

## 2014-07-18 MED ORDER — ACETAMINOPHEN 500 MG PO TABS
1000.0000 mg | ORAL_TABLET | Freq: Once | ORAL | Status: AC
  Administered 2014-07-18: 1000 mg via ORAL
  Filled 2014-07-18: qty 2

## 2014-07-18 MED ORDER — BENZONATATE 100 MG PO CAPS: 100.0000 mg | ORAL_CAPSULE | Freq: Three times a day (TID) | ORAL | Status: DC

## 2014-07-18 MED ORDER — ACETAMINOPHEN 500 MG PO TABS: 500.0000 mg | ORAL_TABLET | Freq: Four times a day (QID) | ORAL | Status: DC | PRN

## 2014-07-18 NOTE — Discharge Instructions (Signed)
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  Please follow up with Dr. Julio Sicks with Neurosurgery for this. 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  Take Medicine as prescribed and follow up as appropriate.    Cervical Sprain A cervical sprain is an injury in the neck in which the strong, fibrous tissues (ligaments) that connect your neck bones stretch or tear. Cervical sprains can range from mild to severe. Severe cervical sprains can cause the neck vertebrae to be unstable. This can lead to damage of the spinal cord and can result in serious nervous system problems. The amount of time it takes for a cervical sprain to get better depends on the cause and extent of the injury. Most cervical sprains heal in 1 to 3 weeks. CAUSES  Severe cervical sprains may be caused by:   Contact sport injuries (such as from football, rugby, wrestling, hockey, auto racing, gymnastics, diving, martial arts, or boxing).   Motor vehicle collisions.   Whiplash injuries. This is an injury from a sudden forward and backward whipping movement of the head and neck.  Falls.  Mild cervical sprains may be caused by:   Being in an awkward position, such as while cradling a telephone between your ear and shoulder.   Sitting in a chair that does not offer proper support.   Working at a poorly Marketing executive station.   Looking up or down for long periods of time.  SYMPTOMS   Pain, soreness, stiffness, or a burning sensation in the front, back, or sides of the neck. This discomfort may develop immediately after the injury or slowly, 24 hours or more after the injury.   Pain or tenderness directly in the middle of the back of the neck.   Shoulder or upper back pain.   Limited ability to move the neck.   Headache.   Dizziness.   Weakness, numbness, or  tingling in the hands or arms.   Muscle spasms.   Difficulty swallowing or chewing.   Tenderness and swelling of the neck.  DIAGNOSIS  Most of the time your health care provider can diagnose a cervical sprain by taking your history and doing a physical exam. Your health care provider will ask about previous neck injuries and any known neck problems, such as arthritis in the neck. X-rays may be taken to find out if there are any other problems, such as with the bones of the neck. Other tests, such as a CT scan or MRI, may also be needed.  TREATMENT  Treatment depends on the severity of the cervical sprain. Mild sprains can be treated with rest, keeping the neck in place (immobilization), and pain medicines. Severe cervical sprains are immediately immobilized. Further treatment is done to help with pain, muscle spasms, and other symptoms and may include:  Medicines, such as pain relievers, numbing medicines, or muscle relaxants.   Physical therapy. This may involve stretching exercises, strengthening exercises, and posture training. Exercises and improved posture can help stabilize the neck, strengthen muscles, and help stop symptoms from returning.  HOME CARE INSTRUCTIONS   Put ice on the injured area.   Put ice in a plastic bag.   Place a towel between your skin and the bag.   Leave the ice on for 15-20 minutes, 3-4 times a day.   If your injury was severe, you may have been given  a cervical collar to wear. A cervical collar is a two-piece collar designed to keep your neck from moving while it heals.  Do not remove the collar unless instructed by your health care provider.  If you have long hair, keep it outside of the collar.  Ask your health care provider before making any adjustments to your collar. Minor adjustments may be required over time to improve comfort and reduce pressure on your chin or on the back of your head.  Ifyou are allowed to remove the collar for  cleaning or bathing, follow your health care provider's instructions on how to do so safely.  Keep your collar clean by wiping it with mild soap and water and drying it completely. If the collar you have been given includes removable pads, remove them every 1-2 days and hand wash them with soap and water. Allow them to air dry. They should be completely dry before you wear them in the collar.  If you are allowed to remove the collar for cleaning and bathing, wash and dry the skin of your neck. Check your skin for irritation or sores. If you see any, tell your health care provider.  Do not drive while wearing the collar.   Only take over-the-counter or prescription medicines for pain, discomfort, or fever as directed by your health care provider.   Keep all follow-up appointments as directed by your health care provider.   Keep all physical therapy appointments as directed by your health care provider.   Make any needed adjustments to your workstation to promote good posture.   Avoid positions and activities that make your symptoms worse.   Warm up and stretch before being active to help prevent problems.  SEEK MEDICAL CARE IF:   Your pain is not controlled with medicine.   You are unable to decrease your pain medicine over time as planned.   Your activity level is not improving as expected.  SEEK IMMEDIATE MEDICAL CARE IF:   You develop any bleeding.  You develop stomach upset.  You have signs of an allergic reaction to your medicine.   Your symptoms get worse.   You develop new, unexplained symptoms.   You have numbness, tingling, weakness, or paralysis in any part of your body.  MAKE SURE YOU:   Understand these instructions.  Will watch your condition.  Will get help right away if you are not doing well or get worse. Document Released: 02/21/2007 Document Revised: 05/01/2013 Document Reviewed: 11/01/2012 [facility] Patient Information 2015 [place],  [place]. This information is not intended to replace advice given to you by your health care provider. Make sure you discuss any questions you have with your health care provider.  Upper Respiratory Infection, Adult An upper respiratory infection (URI) is also sometimes known as the common cold. The upper respiratory tract includes the nose, sinuses, throat, trachea, and bronchi. Bronchi are the airways leading to the lungs. Most people improve within 1 week, but symptoms can last up to 2 weeks. A residual cough may last even longer.  CAUSES Many different viruses can infect the tissues lining the upper respiratory tract. The tissues become irritated and inflamed and often become very moist. Mucus production is also common. A cold is contagious. You can easily spread the virus to others by oral contact. This includes kissing, sharing a glass, coughing, or sneezing. Touching your mouth or nose and then touching a surface, which is then touched by another person, can also spread the virus. SYMPTOMS  Symptoms  typically develop 1 to 3 days after you come in contact with a cold virus. Symptoms vary from person to person. They may include:  Runny nose.  Sneezing.  Nasal congestion.  Sinus irritation.  Sore throat.  Loss of voice (laryngitis).  Cough.  Fatigue.  Muscle aches.  Loss of appetite.  Headache.  Low-grade fever. DIAGNOSIS  You might diagnose your own cold based on familiar symptoms, since most people get a cold 2 to 3 times a year. Your caregiver can confirm this based on your exam. Most importantly, your caregiver can check that your symptoms are not due to another disease such as strep throat, sinusitis, pneumonia, asthma, or epiglottitis. Blood tests, throat tests, and X-rays are not necessary to diagnose a common cold, but they may sometimes be helpful in excluding other more serious diseases. Your caregiver will decide if any further tests are required. RISKS AND COMPLICATIONS    You may be at risk for a more severe case of the common cold if you smoke cigarettes, have chronic heart disease (such as heart failure) or lung disease (such as asthma), or if you have a weakened immune system. The very young and very old are also at risk for more serious infections. Bacterial sinusitis, middle ear infections, and bacterial pneumonia can complicate the common cold. The common cold can worsen asthma and chronic obstructive pulmonary disease (COPD). Sometimes, these complications can require emergency medical care and may be life-threatening. PREVENTION  The best way to protect against getting a cold is to practice good hygiene. Avoid oral or hand contact with people with cold symptoms. Wash your hands often if contact occurs. There is no clear evidence that vitamin C, vitamin E, echinacea, or exercise reduces the chance of developing a cold. However, it is always recommended to get plenty of rest and practice good nutrition. TREATMENT  Treatment is directed at relieving symptoms. There is no cure. Antibiotics are not effective, because the infection is caused by a virus, not by bacteria. Treatment may include:  Increased fluid intake. Sports drinks offer valuable electrolytes, sugars, and fluids.  Breathing heated mist or steam (vaporizer or shower).  Eating chicken soup or other clear broths, and maintaining good nutrition.  Getting plenty of rest.  Using gargles or lozenges for comfort.  Controlling fevers with ibuprofen or acetaminophen as directed by your caregiver.  Increasing usage of your inhaler if you have asthma. Zinc gel and zinc lozenges, taken in the first 24 hours of the common cold, can shorten the duration and lessen the severity of symptoms. Pain medicines may help with fever, muscle aches, and throat pain. A variety of non-prescription medicines are available to treat congestion and runny nose. Your caregiver can make recommendations and may suggest nasal or  lung inhalers for other symptoms.  HOME CARE INSTRUCTIONS   Only take over-the-counter or prescription medicines for pain, discomfort, or fever as directed by your caregiver.  Use a warm mist humidifier or inhale steam from a shower to increase air moisture. This may keep secretions moist and make it easier to breathe.  Drink enough water and fluids to keep your urine clear or pale yellow.  Rest as needed.  Return to work when your temperature has returned to normal or as your caregiver advises. You may need to stay home longer to avoid infecting others. You can also use a face mask and careful hand washing to prevent spread of the virus. SEEK MEDICAL CARE IF:   After the first few days,  you feel you are getting worse rather than better.  You need your caregiver's advice about medicines to control symptoms.  You develop chills, worsening shortness of breath, or brown or red sputum. These may be signs of pneumonia.  You develop yellow or brown nasal discharge or pain in the face, especially when you bend forward. These may be signs of sinusitis.  You develop a fever, swollen neck glands, pain with swallowing, or white areas in the back of your throat. These may be signs of strep throat. SEEK IMMEDIATE MEDICAL CARE IF:   You have a fever.  You develop severe or persistent headache, ear pain, sinus pain, or chest pain.  You develop wheezing, a prolonged cough, cough up blood, or have a change in your usual mucus (if you have chronic lung disease).  You develop sore muscles or a stiff neck. Document Released: 10/20/2000 Document Revised: 07/19/2011 Document Reviewed: 08/01/2013 [facility] Patient Information 2015 [place], [place]. This information is not intended to replace advice given to you by your health care provider. Make sure you discuss any questions you have with your health care provider.   Emergency Department Resource Guide 1) Find a Doctor and Pay Out of Pocket Although  you won't have to find out who is covered by your insurance plan, it is a good idea to ask around and get recommendations. You will then need to call the office and see if the doctor you have chosen will accept you as a new patient and what types of options they offer for patients who are self-pay. Some doctors offer discounts or will set up payment plans for their patients who do not have insurance, but you will need to ask so you aren't surprised when you get to your appointment.  2) Contact Your Local Health Department Not all health departments have doctors that can see patients for sick visits, but many do, so it is worth a call to see if yours does. If you don't know where your local health department is, you can check in your phone book. The CDC also has a tool to help you locate your state's health department, and many state websites also have listings of all of their local health departments.  3) Find a Walk-in Clinic If your illness is not likely to be very severe or complicated, you may want to try a walk in clinic. These are popping up all over the country in pharmacies, drugstores, and shopping centers. They're usually staffed by nurse practitioners or physician assistants that have been trained to treat common illnesses and complaints. They're usually fairly quick and inexpensive. However, if you have serious medical issues or chronic medical problems, these are probably not your best option.  No Primary Care Doctor: - Call Health Connect at  [phone] - they can help you locate a primary care doctor that  accepts your insurance, provides certain services, etc. - Physician Referral Service- [phone]  Chronic Pain Problems: Organization         Address  Phone   Notes  [facility]  [phone] Patients need to be referred by their primary care doctor.   Medication Assistance: Organization         Address  Phone   Notes  [facility] [address] [place] [place], [place] [address] [phone] --Must be a resident of [facility] -- Must have NO insurance coverage whatsoever (no Medicaid/ Medicare, etc.) -- The pt. MUST have a  primary care doctor that directs their care regularly and follows them in the community   [facility]  [phone]   [employer]  [phone]    Agencies that provide inexpensive medical care: Organization         Address  Phone   Notes  [facility]  [phone]   [facility]    [phone]   [facility] [address] [place], [place] [address] [phone]   [facility] [address] [place] [phone]   Planned Parenthood    [phone]   [facility]    [phone]   [facility] and [facility]  [address], [place] Phone:  [phone], Fax:  [phone] Hours of Operation:  9 am - 6 pm, M-F.  Also accepts Medicaid/Medicare and self-pay.  [facility]  [address], [place] Phone: [phone], Fax: [phone]. Hours of Operation:  8:30 am - 5:30 pm, M-F.  Also accepts Medicaid and self-pay.  [facility] [place] [address], [place] Phone: [phone]   [facility] [address] Natasha Bence [place], [place] [phone], Ext. [phone] Mondays & Thursdays: 7-9 AM.  First 15 patients are seen on a first come, first serve basis.    Medicaid-accepting [facility] Providers:  Organization         Address  Phone   Notes  [facility] [address], [place] [phone] Also accepts self-pay patients.  [facility] [address] Laurell Josephs [place], [place]  [phone]   [facility] [address], [place] [phone]   [facility] [address], [place] [phone]   Renaye Rakers [address], [place]   [phone] Only accepts [place] Access [place] patients after they have their name applied to their card.   Self-Pay (no insurance) in [facility]:  Organization         Address  Phone   Notes  Sickle Cell Patients, [facility] [address] [place], [place] [phone]   [facility] [address] [place], [place] [phone]   [facility] [place]  [address], [place] [phone]   [facility]/Dr. Osei-Bonsu  [address], [place] or [address], [place] [phone] Phone number for both [place] and [place] locations is the same.  [facility] and [facility] [address] [place] [phone]   [facility] [address] [place] or [address] [phone]   [facility] [address] [place] [phone], phone; [phone], fax Sees patients 1st and 3rd Saturday of every month.  Must not qualify for public or private insurance (i.e. Medicaid, Medicare, [place] Health Choice, Veterans' Benefits)  Household income should be no more than 200% of the poverty level The clinic cannot treat you if you are pregnant or think you are pregnant  Sexually transmitted diseases are not treated at the clinic.    Dental Care: Organization         Address  Phone  Notes  [facility] Department of [facility] [address] [place], [place] [phone]  Accepts children up to age 51 who are enrolled in Medicaid or [place] Health Choice; pregnant women with a Medicaid card; and children who have applied for Medicaid or [place] Health Choice, but were declined, whose parents can pay a reduced fee at time of service.  [facility] Department of [facility]  [address], [place] [phone] Accepts  children up to age 31 who are enrolled in [place] or [place] Health Choice; pregnant women with a Medicaid card; and children who have applied for Medicaid or [place] Health Choice, but were declined, whose parents can pay a reduced fee at time of service.  [facility]  [address] [place], [place] [phone] Patients are seen by appointment only. Walk-ins are not accepted. [facility] will see patients 82 years of age and older. Monday - Tuesday (8am-5pm) Most Wednesdays (8:30-5pm) $30 per visit, cash only  [facility] Adult Dental Access PRO[facility]  [address], [facility] [phone] Patients are seen by appointment only. Walk-ins are not accepted. [facility] will see patients 106 years of age and older. One Wednesday Evening (Monthly: Volunteer Based).  $30 per visit, cash only  [employer]  [phone] for adults; Children under age 30, call Graduate Pediatric Dentistry at [phone]. Children aged 40-14, please call [phone] to request a pediatric application.  Dental services are provided in all areas of dental care including fillings, crowns and bridges, complete and partial dentures, implants, gum treatment, root canals, and extractions. Preventive care is also provided. Treatment is provided to both adults and children. Patients are selected via a lottery and there is often a waiting list.   [facility] [address], [place]  [phone] [email or web address]   [facility] [address] [place], [place] [phone], Ext. [phone] Second and Fourth Thursday of each month, opens at 6:30 AM; Clinic ends at 9 AM.  Patients are seen on a first-come first-served basis, and a limited number are seen during each clinic.   [facility]  [address] Griffins [place], [place] [phone]   Eligibility Requirements You must have lived in [place], [place], or [place] counties for at least the  last three months.   You cannot be eligible for state or federal sponsored [place], including [employer], [place], or [employer].   You generally cannot be eligible for healthcare insurance through your employer.    How to apply: Eligibility screenings are held every Tuesday and Wednesday afternoon from 1:00 pm until 4:00 pm. You do not need an appointment for the interview!  [facility] [address], [place], [place] [phone]   [facility] Health Department  [phone]   [facility] Health Department  [phone]   [facility] Health Department  [phone]    Behavioral Health Resources in the Community: Intensive Outpatient Programs Organization         Address  Phone  Notes  [facility] [address], [place], [place] [phone]   [facility] [address] [place], [place] [phone]   [facility] [address] [place], [place]  [phone]   [facility] Mental Health [address],  [place], [place] [phone] or [phone]   Substance Abuse Resources Organization         Address  Phone  Notes  [facility]  [phone]  [facility]  [phone]   [facility]  [phone]   Floydene Flock  [phone]   Residential & Outpatient Substance Abuse Program  [phone]   Psychological Services Organization         Address  Phone  Notes  [facility] Health  [phone]   [facility]  [phone]   [facility] [address] [place] [phone] or [phone]    Mobile Crisis Teams Organization         Address  Phone  Notes  [facility], Mobile Crisis Care Unit  [phone]   [facility]  [address] [place], [place] [phone]   Doristine Locks [address] [place] [phone]     Self-Help/Support Groups Organization         Address  Phone             Notes  [facility] [place] - variety of support groups  336- I7437963 Call for more information  Narcotics Anonymous (NA), [facility] [address], [employer] [place]  2 meetings at this location   Statistician         Address  Phone  Notes  [facility] [address],    [place]  [phone]   [facility]  [address] [place] [address], [place], [place] [phone]   [facility] [address] [place], [place] [phone] Admissions: 8am-3pm M-F  [facility] [address],    [place], [place] [phone]   [facility] [address] [place], [place], [place] [phone]   [facility] [address],  [place], [place] [phone]   [facility] - Intensive Outpatient [address]urell Joseph[address], [place], [place] [phone]   [facility] (Addiction [facility] [address] [place].,  [place], [place] [phone] or [phone]   [facility] ([facility]) [address] [place], [place] [phone] Accepts Medicaid  [facility] [address],  [place] [phone] Substance Abuse/Addiction Treatment   [facility] Organization         Address  Phone  Notes  [facility]  [phone]   Angie Fava, PhD [address] Ervin Knack [place], [place]   [phone] or [phone]   [facility] Behavioral   [address] [place], [place] [phone]   [facility] [address] [place], [place] [phone] Insurance/Medicaid/sponsorship through [facility] and Families [address]                                    [place], [place] [phone] Therapy/tele-psych/case  [facility] [address], [place] [phone]    Dr. Lolly Mustache  [phone]   [facility] [place]  [employer] [facility] Dept. 1) [address], [place] 2) [address] [place] 3)  [address], [place] [phone]  [phone]   [facility] Child Abuse Hotline 3404513272 or 6196571100 (After Hours)

## 2014-07-18 NOTE — ED Provider Notes (Signed)
  Physical Exam  BP 133/81 mmHg  Pulse 71  Temp(Src) 100.1 F (37.8 C) (Oral)  Resp 12  SpO2 100%  Physical Exam  Constitutional: He is oriented to person, place, and time. He appears well-developed and well-nourished. No distress.  HENT:  Head: Normocephalic and atraumatic.  Eyes: Right eye exhibits no discharge. Left eye exhibits no discharge. No scleral icterus.  Neck: Normal range of motion.  Pulmonary/Chest: Effort normal. No respiratory distress.  Musculoskeletal: Normal range of motion.  Neurological: He is alert and oriented to person, place, and time. He has normal strength. No cranial nerve deficit or sensory deficit. He displays a negative Romberg sign. GCS eye subscore is 4. GCS verbal subscore is 5. GCS motor subscore is 6.  Patient fully alert, answering questions appropriately in full, clear sentences. Cranial nerve II through XII grossly intact. Motor strength 5 out of 5 in all major muscle groups of upper and lower extremities. Distal sensation intact.  Skin: Skin is warm and dry. He is not diaphoretic.  Psychiatric: He has a normal mood and affect.  Nursing note and vitals reviewed.   ED Course  Procedures  MDM Patient signed out to me by Langston MaskerKaren Sofia, PA-C with plan to follow-up on patient's radiographs, likely patient is experiencing an upper respiratory infection as well as having some chronic complaints. Based on patient's findings below along with clinical findings, there is no concern for any acute process going on, patient appears to have multiple complaints which are ongoing and will most benefit from appropriate follow-up with outpatient resources. Patient noted to have fever in the ER, patient given Tylenol which improved his fever. On my examination patient does not have any signs or symptoms consistent with any bacterial infection which would require antibiotic therapy at this time.  Chest x-ray unremarkable for acute pathology. No pneumonia.  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 No acute osseous findings or malalignment.  Correlated with patient's physical exam and clinical picture, likely this is a degenerative process, low suspicion for acute injury with this, although patient has been experiencing and neck pain for 9 months status post fall. Patient has no neurologic deficit, and does not describe any radicular symptoms, or symptoms that are concerning for a spinal compression. We'll refer patient to neurosurgery for follow-up on these findings and this pain.  Hand radiographs remarkable for impression: There is no evidence of fracture or dislocation. There is no evidence of arthropathy or other focal bone abnormality. 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  Based on the fact that patient's hand pain is intermittent, and he is asymptomatic in the ER, doubt any acute pathology related to this finding. We will have patient follow-up with primary care physician for this finding. Patient referred to community health and wellness clinic. We discussed return precautions with patient, and encourage him to call or return to the ER with any worsening symptoms or questions or concerns. Patient verbalizes understanding and agreement of this plan.  BP 133/81 mmHg  Pulse 71  Temp(Src) 100.1 F (37.8 C) (Oral)  Resp 12  SpO2 100%  Signed,  Ladona MowJoe Zaiya Annunziato, PA-C 7:15 PM    Ladona MowJoe Gerritt Galentine, PA-C 07/18/14 1915  Tilden FossaElizabeth Rees, MD 07/18/14 2252

## 2014-07-18 NOTE — ED Notes (Signed)
Pt presents to department for evaluation of cough, sore throat and generalized pain all over body. Pt states "I've had pain all over my body for many years." pt is alert and oriented x4. NAD.

## 2014-07-18 NOTE — ED Notes (Signed)
Patient transported to X-ray 

## 2014-07-18 NOTE — ED Notes (Signed)
PA aware of pts temp done at discharge. Tylenol ordered, temp to be rechecked before d/c patient.

## 2014-07-18 NOTE — ED Provider Notes (Signed)
CSN: [id number]     Arrival date & time 07/18/14  1327 History  This chart was scribed for non-physician practitioner, Langston MaskerKaren Raymir Frommelt, PA-C, working with Toy CookeyMegan Docherty, MD by Charline BillsEssence Howell, ED Scribe. This patient was seen in room TR07C/TR07C and the patient's care was started at 3:14 PM.   Chief Complaint  Patient presents with  . Pain  . Cough  . Sore Throat   The history is provided by the patient. No language interpreter was used.   HPI Comments: Chris Young is a 59 y.o. male, with no pertinent medical history, who presents to the Emergency Department complaining of persistent dry cough for the past week. He reports associated unchanged, burning, substernal chest pain for 3 years. Pt was seen by gastroenterologist Dr. Elnoria HowardHung last year and had an upper and lower endoscopies done. Pt was started on Prilosec which he suspects is depleting his calcium levels. He states that he had a CXR obtained a few years ago and has followed up with a cardiologist. Pt also presents with neck pain and R hand pain for the past 9 months following a fall. He is R hand dominant. Pt is a nonsmoker.  History reviewed. No pertinent past medical history. History reviewed. No pertinent past surgical history. History reviewed. No pertinent family history. History  Substance Use Topics  . Smoking status: Never Smoker   . Smokeless tobacco: Not on file  . Alcohol Use: No    Review of Systems  Respiratory: Positive for cough.   Cardiovascular: Positive for chest pain.  Musculoskeletal: Positive for arthralgias and neck pain.   Allergies  Review of patient's allergies indicates no known allergies.  Home Medications   Prior to Admission medications   Medication Sig Start Date End Date Taking? Authorizing Provider  dicyclomine (BENTYL) 20 MG tablet Take 1 tablet (20 mg total) by mouth 2 (two) times daily. 09/02/11 09/01/12  Thomasene LotBrigitte Nguyen, PA-C  ibuprofen (ADVIL,MOTRIN) 200 MG tablet Take 200-400 mg by mouth  every 6 (six) hours as needed. As needed for occasional headaches.    Historical Provider, MD  OVER THE COUNTER MEDICATION Take 1 tablet by mouth at bedtime. Allergy medication for runny nose and itching.    Historical Provider, MD   BP 133/74 mmHg  Pulse 72  Temp(Src) 98.2 F (36.8 C) (Oral)  Resp 18  SpO2 97% Physical Exam  Constitutional: He is oriented to person, place, and time. He appears well-developed and well-nourished. No distress.  HENT:  Head: Normocephalic and atraumatic.  Eyes: Conjunctivae and EOM are normal.  Neck: Neck supple. No tracheal deviation present.  Cardiovascular: Normal rate, regular rhythm and normal heart sounds.   Pulmonary/Chest: Effort normal and breath sounds normal. No respiratory distress.  Abdominal: Soft. There is no tenderness.  Musculoskeletal: Normal range of motion.  C-spine diffusely tender. Crepitus. Swollen R first metacarpal head.   Neurological: He is alert and oriented to person, place, and time.  Skin: Skin is warm and dry.  Psychiatric: He has a normal mood and affect. His behavior is normal.  Nursing note and vitals reviewed.  ED Course  Procedures (including critical care time) DIAGNOSTIC STUDIES: Oxygen Saturation is 97% on RA, normal by my interpretation.    COORDINATION OF CARE: 3:19 PM-Discussed treatment plan which includes CXR and GI cocktail with pt at bedside and pt agreed to plan.   Labs Review Labs Reviewed - No data to display  Imaging Review No results found.   EKG Interpretation None  MDM   Final diagnoses:  Cough  Hand pain, right  Neck pain of over 3 months duration     I personally performed the services in this documentation, which was scribed in my presence.  The recorded information has been reviewed and considered.   Barnet Pall.   Lonia Skinner Shumway, PA-C 07/19/14 1610  Arby Barrette, MD 07/19/14 [phone]

## 2014-09-04 ENCOUNTER — Encounter: Payer: Self-pay | Admitting: Family Medicine

## 2014-09-04 ENCOUNTER — Ambulatory Visit (INDEPENDENT_AMBULATORY_CARE_PROVIDER_SITE_OTHER): Payer: 59 | Admitting: Family Medicine

## 2014-09-04 VITALS — BP 140/90 | HR 68 | Temp 97.4°F | Ht 70.0 in | Wt 164.1 lb

## 2014-09-04 DIAGNOSIS — M542 Cervicalgia: Secondary | ICD-10-CM

## 2014-09-04 DIAGNOSIS — R030 Elevated blood-pressure reading, without diagnosis of hypertension: Secondary | ICD-10-CM | POA: Diagnosis not present

## 2014-09-04 DIAGNOSIS — Z7189 Other specified counseling: Secondary | ICD-10-CM | POA: Diagnosis not present

## 2014-09-04 DIAGNOSIS — IMO0001 Reserved for inherently not codable concepts without codable children: Secondary | ICD-10-CM

## 2014-09-04 DIAGNOSIS — K219 Gastro-esophageal reflux disease without esophagitis: Secondary | ICD-10-CM

## 2014-09-04 DIAGNOSIS — Z7689 Persons encountering health services in other specified circumstances: Secondary | ICD-10-CM

## 2014-09-04 HISTORY — DX: Gastro-esophageal reflux disease without esophagitis: K21.9

## 2014-09-04 NOTE — Progress Notes (Signed)
Pre visit review using our clinic review tool, if applicable. No additional management support is needed unless otherwise documented below in the visit note. 

## 2014-09-04 NOTE — Progress Notes (Addendum)
HPI:  Chris Young is here to establish care. [place] Recently got insurance.  Has the following chronic problems that require follow up and concerns today:  GERD: -reports had evaluation with Dr. Elnoria HowardHung for abd pain in 2014 -reports had colonoscopy and EGD and told had GERD -he used to take omeprazole 40mg  daily, he does not take anymore -now feels fine, denies frequent heartburn, dysphagia  HTN: -mildly elevated blood pressure for many years -reports never took medication for this -denies: CP, SOB, DOE  Neck Pain, R finger pain: -he was [facility] and was referred to Dr. Dutch QuintPoole for DDD -reports symptoms are mild, intermittent pain in the neck and occ pain in R 1st digit and had xrays -denies weakness, numbness, malaise   ROS negative for unless reported above: fevers, unintentional weight loss, hearing or vision loss, chest pain, palpitations, struggling to breath, hemoptysis, melena, hematochezia, hematuria, falls, loc, si, thoughts of self harm  Past Medical History  Diagnosis Date  . GERD (gastroesophageal reflux disease) 09/04/2014  . Elevated blood pressure     Past Surgical History  Procedure Laterality Date  . Eye surgery      Family History  Problem Relation Age of Onset  . Stomach cancer Father     History   Social History  . Marital Status: Married    Spouse Name: N/A  . Number of Children: N/A  . Years of Education: N/A   Social History Main Topics  . Smoking status: Never Smoker   . Smokeless tobacco: Not on file  . Alcohol Use: Not on file  . Drug Use: Not on file  . Sexual Activity: Not on file   Other Topics Concern  . None   Social History Narrative   Work or School: Optician, [employer] - lifts heavy boxes      Home Situation: lives with his wife      Spiritual Beliefs: muslim      Lifestyle: active; diet healthy          No current outpatient prescriptions on  file.  EXAMCeasar Mons:  Filed Vitals:   09/04/14 0812  BP: 140/90  Pulse: 68  Temp: 97.4 F (36.3 C)    Body mass index is 23.55 kg/(m^2).  GENERAL: vitals reviewed and listed above, alert, oriented, appears well hydrated and in no acute distress  HEENT: atraumatic, conjunttiva clear, no obvious abnormalities on inspection of external nose and ears  NECK: no obvious masses on inspection  LUNGS: clear to auscultation bilaterally, no wheezes, rales or rhonchi, good air movement  CV: HRRR, no peripheral edema  MS: moves all extremities without noticeable abnormality  PSYCH: pleasant and cooperative, no obvious depression or anxiety  ASSESSMENT AND PLAN:  Discussed the following assessment and plan:  Gastroesophageal reflux disease, esophagitis presence not specified  Elevated BP  Neck pain  Encounter to establish care  -We reviewed the PMH, PSH, FH, SH, Meds and Allergies. -We addressed current concerns per orders and patient instructions. -We have asked for records for pertinent exams, studies, vaccines and notes from previous providers. Advised my assistant to obtain recent ED notes for his neck pain and finger pain. -He reports these issues are minor and he has opted not to see specialist at this time - advised to call if worsen or persist or will advise specialist per review of notes as needed. -advised to consider tx for BP if remains elevated - he will  consider -advise follow up and CPE in 3 months advised to come fasting for labs -We have advised patient to follow up per instructions below.   -Patient advised to return or notify a doctor immediately if symptoms worsen or persist or new concerns arise.  Patient Instructions  BEFORE YOU LEAVE: -physical exam in 3 months, come fasting   We recommend the following healthy lifestyle measures: - eat a healthy diet consisting of lots of vegetables, fruits, beans, nuts, seeds, healthy meats such as white chicken and fish and  whole grains.  - avoid fried foods, fast food, processed foods, sodas, red meet and other fattening foods.  - get a least 150 minutes of aerobic exercise per week.       Kriste Basque R.

## 2014-09-04 NOTE — Patient Instructions (Signed)
BEFORE YOU LEAVE: -physical exam in 3 months, come fasting   We recommend the following healthy lifestyle measures: - eat a healthy diet consisting of lots of vegetables, fruits, beans, nuts, seeds, healthy meats such as white chicken and fish and whole grains.  - avoid fried foods, fast food, processed foods, sodas, red meet and other fattening foods.  - get a least 150 minutes of aerobic exercise per week.

## 2014-12-09 ENCOUNTER — Ambulatory Visit (INDEPENDENT_AMBULATORY_CARE_PROVIDER_SITE_OTHER): Payer: 59 | Admitting: Family Medicine

## 2014-12-09 DIAGNOSIS — R69 Illness, unspecified: Secondary | ICD-10-CM

## 2014-12-09 NOTE — Progress Notes (Signed)
NO SHOW FOR CPE: On review of chart prior to appointment:  GERD: -reports had evaluation with Dr. Elnoria Howard for abd pain in 2014 -reports had colonoscopy and EGD and told had GERD -he used to take omeprazole  daily, he does not take anymore  HTN: -mildly elevated blood pressure for many years -reports never took medication for this -denies: CP, SOB, DOE  Neck Pain, R finger pain: -he was seen for this at [facility] and was referred to Dr. Dutch Quint for DDD -reports symptoms are mild, intermittent pain in the neck and occ pain in R 1st digit and had xrays -denies weakness, numbness, malaise

## 2014-12-30 ENCOUNTER — Encounter (HOSPITAL_COMMUNITY): Payer: Self-pay | Admitting: Physical Medicine and Rehabilitation

## 2015-09-17 DIAGNOSIS — M62830 Muscle spasm of back: Secondary | ICD-10-CM | POA: Diagnosis not present

## 2015-09-17 DIAGNOSIS — M546 Pain in thoracic spine: Secondary | ICD-10-CM | POA: Diagnosis not present

## 2015-09-18 DIAGNOSIS — M62830 Muscle spasm of back: Secondary | ICD-10-CM | POA: Diagnosis not present

## 2015-09-18 DIAGNOSIS — M546 Pain in thoracic spine: Secondary | ICD-10-CM | POA: Diagnosis not present

## 2015-11-14 DIAGNOSIS — K219 Gastro-esophageal reflux disease without esophagitis: Secondary | ICD-10-CM | POA: Diagnosis not present

## 2015-11-24 DIAGNOSIS — K219 Gastro-esophageal reflux disease without esophagitis: Secondary | ICD-10-CM | POA: Diagnosis not present

## 2015-11-24 DIAGNOSIS — Z1211 Encounter for screening for malignant neoplasm of colon: Secondary | ICD-10-CM | POA: Diagnosis not present

## 2015-11-24 DIAGNOSIS — R140 Abdominal distension (gaseous): Secondary | ICD-10-CM | POA: Diagnosis not present

## 2015-12-09 DIAGNOSIS — Z1211 Encounter for screening for malignant neoplasm of colon: Secondary | ICD-10-CM | POA: Diagnosis not present

## 2015-12-09 DIAGNOSIS — D122 Benign neoplasm of ascending colon: Secondary | ICD-10-CM | POA: Diagnosis not present

## 2015-12-09 DIAGNOSIS — D120 Benign neoplasm of cecum: Secondary | ICD-10-CM | POA: Diagnosis not present

## 2015-12-09 DIAGNOSIS — K635 Polyp of colon: Secondary | ICD-10-CM | POA: Diagnosis not present

## 2016-02-20 DIAGNOSIS — E785 Hyperlipidemia, unspecified: Secondary | ICD-10-CM | POA: Diagnosis not present

## 2016-02-20 DIAGNOSIS — Z6823 Body mass index (BMI) 23.0-23.9, adult: Secondary | ICD-10-CM | POA: Diagnosis not present

## 2016-02-20 DIAGNOSIS — R1083 Colic: Secondary | ICD-10-CM | POA: Diagnosis not present

## 2016-05-21 DIAGNOSIS — R10814 Left lower quadrant abdominal tenderness: Secondary | ICD-10-CM | POA: Diagnosis not present

## 2016-05-21 DIAGNOSIS — K5900 Constipation, unspecified: Secondary | ICD-10-CM | POA: Diagnosis not present

## 2016-05-21 DIAGNOSIS — H5330 Unspecified disorder of binocular vision: Secondary | ICD-10-CM | POA: Diagnosis not present

## 2016-05-21 DIAGNOSIS — E785 Hyperlipidemia, unspecified: Secondary | ICD-10-CM | POA: Diagnosis not present

## 2016-05-31 DIAGNOSIS — H26491 Other secondary cataract, right eye: Secondary | ICD-10-CM | POA: Diagnosis not present

## 2016-05-31 DIAGNOSIS — H04123 Dry eye syndrome of bilateral lacrimal glands: Secondary | ICD-10-CM | POA: Diagnosis not present

## 2016-05-31 DIAGNOSIS — Z961 Presence of intraocular lens: Secondary | ICD-10-CM | POA: Diagnosis not present

## 2016-06-07 DIAGNOSIS — H26411 Soemmering's ring, right eye: Secondary | ICD-10-CM | POA: Diagnosis not present

## 2016-06-18 DIAGNOSIS — K5900 Constipation, unspecified: Secondary | ICD-10-CM | POA: Diagnosis not present

## 2016-06-18 DIAGNOSIS — K219 Gastro-esophageal reflux disease without esophagitis: Secondary | ICD-10-CM | POA: Diagnosis not present

## 2016-06-18 DIAGNOSIS — F5102 Adjustment insomnia: Secondary | ICD-10-CM | POA: Diagnosis not present

## 2016-06-18 DIAGNOSIS — E785 Hyperlipidemia, unspecified: Secondary | ICD-10-CM | POA: Diagnosis not present

## 2016-07-16 DIAGNOSIS — R10814 Left lower quadrant abdominal tenderness: Secondary | ICD-10-CM | POA: Diagnosis not present

## 2016-07-16 DIAGNOSIS — Z6823 Body mass index (BMI) 23.0-23.9, adult: Secondary | ICD-10-CM | POA: Diagnosis not present

## 2016-07-16 DIAGNOSIS — F064 Anxiety disorder due to known physiological condition: Secondary | ICD-10-CM | POA: Diagnosis not present

## 2016-08-02 DIAGNOSIS — R1031 Right lower quadrant pain: Secondary | ICD-10-CM | POA: Diagnosis not present

## 2016-08-02 DIAGNOSIS — R1032 Left lower quadrant pain: Secondary | ICD-10-CM | POA: Diagnosis not present

## 2016-08-02 DIAGNOSIS — R079 Chest pain, unspecified: Secondary | ICD-10-CM | POA: Diagnosis not present

## 2016-08-02 DIAGNOSIS — R1013 Epigastric pain: Secondary | ICD-10-CM | POA: Diagnosis not present

## 2016-10-29 DIAGNOSIS — F064 Anxiety disorder due to known physiological condition: Secondary | ICD-10-CM | POA: Diagnosis not present

## 2017-03-29 DIAGNOSIS — Z961 Presence of intraocular lens: Secondary | ICD-10-CM | POA: Diagnosis not present

## 2017-03-29 DIAGNOSIS — D485 Neoplasm of uncertain behavior of skin: Secondary | ICD-10-CM | POA: Diagnosis not present

## 2017-03-29 DIAGNOSIS — H04123 Dry eye syndrome of bilateral lacrimal glands: Secondary | ICD-10-CM | POA: Diagnosis not present

## 2017-04-29 DIAGNOSIS — J399 Disease of upper respiratory tract, unspecified: Secondary | ICD-10-CM | POA: Diagnosis not present

## 2017-04-29 DIAGNOSIS — K210 Gastro-esophageal reflux disease with esophagitis: Secondary | ICD-10-CM | POA: Diagnosis not present

## 2017-06-14 DIAGNOSIS — H04123 Dry eye syndrome of bilateral lacrimal glands: Secondary | ICD-10-CM | POA: Diagnosis not present

## 2017-06-14 DIAGNOSIS — Z961 Presence of intraocular lens: Secondary | ICD-10-CM | POA: Diagnosis not present

## 2017-07-20 DIAGNOSIS — K080 Exfoliation of teeth due to systemic causes: Secondary | ICD-10-CM | POA: Diagnosis not present

## 2017-07-29 DIAGNOSIS — Z6824 Body mass index (BMI) 24.0-24.9, adult: Secondary | ICD-10-CM | POA: Diagnosis not present

## 2017-07-29 DIAGNOSIS — F064 Anxiety disorder due to known physiological condition: Secondary | ICD-10-CM | POA: Diagnosis not present

## 2017-07-29 DIAGNOSIS — K210 Gastro-esophageal reflux disease with esophagitis: Secondary | ICD-10-CM | POA: Diagnosis not present

## 2017-07-29 DIAGNOSIS — K5900 Constipation, unspecified: Secondary | ICD-10-CM | POA: Diagnosis not present

## 2017-10-28 DIAGNOSIS — F064 Anxiety disorder due to known physiological condition: Secondary | ICD-10-CM | POA: Diagnosis not present

## 2017-10-28 DIAGNOSIS — K219 Gastro-esophageal reflux disease without esophagitis: Secondary | ICD-10-CM | POA: Diagnosis not present

## 2017-10-28 DIAGNOSIS — E785 Hyperlipidemia, unspecified: Secondary | ICD-10-CM | POA: Diagnosis not present

## 2017-12-12 DIAGNOSIS — D22121 Melanocytic nevi of left upper eyelid, including canthus: Secondary | ICD-10-CM | POA: Diagnosis not present

## 2017-12-12 DIAGNOSIS — Z961 Presence of intraocular lens: Secondary | ICD-10-CM | POA: Diagnosis not present

## 2017-12-12 DIAGNOSIS — H04123 Dry eye syndrome of bilateral lacrimal glands: Secondary | ICD-10-CM | POA: Diagnosis not present

## 2018-03-31 DIAGNOSIS — F064 Anxiety disorder due to known physiological condition: Secondary | ICD-10-CM | POA: Diagnosis not present

## 2018-03-31 DIAGNOSIS — Z6823 Body mass index (BMI) 23.0-23.9, adult: Secondary | ICD-10-CM | POA: Diagnosis not present

## 2018-03-31 DIAGNOSIS — K219 Gastro-esophageal reflux disease without esophagitis: Secondary | ICD-10-CM | POA: Diagnosis not present

## 2018-03-31 DIAGNOSIS — E785 Hyperlipidemia, unspecified: Secondary | ICD-10-CM | POA: Diagnosis not present

## 2018-07-04 DIAGNOSIS — E785 Hyperlipidemia, unspecified: Secondary | ICD-10-CM | POA: Diagnosis not present

## 2018-07-04 DIAGNOSIS — K210 Gastro-esophageal reflux disease with esophagitis: Secondary | ICD-10-CM | POA: Diagnosis not present

## 2018-07-04 DIAGNOSIS — N400 Enlarged prostate without lower urinary tract symptoms: Secondary | ICD-10-CM | POA: Diagnosis not present

## 2018-11-15 DIAGNOSIS — I861 Scrotal varices: Secondary | ICD-10-CM | POA: Diagnosis not present

## 2018-11-15 DIAGNOSIS — Z1331 Encounter for screening for depression: Secondary | ICD-10-CM | POA: Diagnosis not present

## 2018-11-15 DIAGNOSIS — E7800 Pure hypercholesterolemia, unspecified: Secondary | ICD-10-CM | POA: Diagnosis not present

## 2018-11-15 DIAGNOSIS — H269 Unspecified cataract: Secondary | ICD-10-CM | POA: Diagnosis not present

## 2018-11-15 DIAGNOSIS — Z0000 Encounter for general adult medical examination without abnormal findings: Secondary | ICD-10-CM | POA: Diagnosis not present

## 2018-12-11 DIAGNOSIS — H04123 Dry eye syndrome of bilateral lacrimal glands: Secondary | ICD-10-CM | POA: Diagnosis not present

## 2018-12-11 DIAGNOSIS — H26492 Other secondary cataract, left eye: Secondary | ICD-10-CM | POA: Diagnosis not present

## 2018-12-11 DIAGNOSIS — D23112 Other benign neoplasm of skin of right lower eyelid, including canthus: Secondary | ICD-10-CM | POA: Diagnosis not present

## 2018-12-11 DIAGNOSIS — Z961 Presence of intraocular lens: Secondary | ICD-10-CM | POA: Diagnosis not present

## 2019-03-22 DIAGNOSIS — K219 Gastro-esophageal reflux disease without esophagitis: Secondary | ICD-10-CM | POA: Diagnosis not present

## 2019-03-22 DIAGNOSIS — Z8719 Personal history of other diseases of the digestive system: Secondary | ICD-10-CM | POA: Diagnosis not present

## 2019-06-22 ENCOUNTER — Other Ambulatory Visit: Payer: Self-pay | Admitting: Family Medicine

## 2019-06-22 ENCOUNTER — Ambulatory Visit
Admission: RE | Admit: 2019-06-22 | Discharge: 2019-06-22 | Disposition: A | Discharge status: Home / Self Care | Payer: 59 | Source: Ambulatory Visit | Attending: Family Medicine | Admitting: Family Medicine

## 2019-06-22 DIAGNOSIS — M5489 Other dorsalgia: Secondary | ICD-10-CM

## 2019-06-22 DIAGNOSIS — R102 Pelvic and perineal pain: Secondary | ICD-10-CM

## 2019-06-22 DIAGNOSIS — M542 Cervicalgia: Secondary | ICD-10-CM

## 2019-11-02 ENCOUNTER — Other Ambulatory Visit: Payer: Self-pay | Admitting: Family Medicine

## 2019-11-02 ENCOUNTER — Ambulatory Visit
Admission: RE | Admit: 2019-11-02 | Discharge: 2019-11-02 | Disposition: A | Discharge status: Home / Self Care | Payer: 59 | Source: Ambulatory Visit | Attending: Family Medicine | Admitting: Family Medicine

## 2019-11-02 DIAGNOSIS — R52 Pain, unspecified: Secondary | ICD-10-CM

## 2020-10-18 ENCOUNTER — Emergency Department (HOSPITAL_COMMUNITY): Payer: 59

## 2020-10-18 ENCOUNTER — Encounter (HOSPITAL_COMMUNITY): Payer: Self-pay | Admitting: Emergency Medicine

## 2020-10-18 ENCOUNTER — Emergency Department (HOSPITAL_COMMUNITY)
Admission: EM | Admit: 2020-10-18 | Discharge: 2020-10-19 | Disposition: A | Discharge status: Home / Self Care | Payer: 59 | Attending: Emergency Medicine | Admitting: Emergency Medicine

## 2020-10-18 DIAGNOSIS — R109 Unspecified abdominal pain: Secondary | ICD-10-CM | POA: Diagnosis not present

## 2020-10-18 DIAGNOSIS — K219 Gastro-esophageal reflux disease without esophagitis: Secondary | ICD-10-CM | POA: Insufficient documentation

## 2020-10-18 DIAGNOSIS — I10 Essential (primary) hypertension: Secondary | ICD-10-CM | POA: Diagnosis not present

## 2020-10-18 DIAGNOSIS — M545 Low back pain, unspecified: Secondary | ICD-10-CM | POA: Insufficient documentation

## 2020-10-18 LAB — CBC WITH DIFFERENTIAL/PLATELET
Abs Immature Granulocytes: 0 10*3/uL (ref 0.00–0.07)
Basophils Absolute: 0 10*3/uL (ref 0.0–0.1)
Basophils Relative: 1 %
Eosinophils Absolute: 0.1 10*3/uL (ref 0.0–0.5)
Eosinophils Relative: 3 %
HCT: 44.2 % (ref 39.0–52.0)
Hemoglobin: 14.5 g/dL (ref 13.0–17.0)
Immature Granulocytes: 0 %
Lymphocytes Relative: 40 %
Lymphs Abs: 1.6 10*3/uL (ref 0.7–4.0)
MCH: 30.5 pg (ref 26.0–34.0)
MCHC: 32.8 g/dL (ref 30.0–36.0)
MCV: 93.1 fL (ref 80.0–100.0)
Monocytes Absolute: 0.5 10*3/uL (ref 0.1–1.0)
Monocytes Relative: 12 %
Neutro Abs: 1.8 10*3/uL (ref 1.7–7.7)
Neutrophils Relative %: 44 %
Platelets: 169 10*3/uL (ref 150–400)
RBC: 4.75 MIL/uL (ref 4.22–5.81)
RDW: 12.8 % (ref 11.5–15.5)
WBC: 4 10*3/uL (ref 4.0–10.5)
nRBC: 0 % (ref 0.0–0.2)

## 2020-10-18 LAB — URINALYSIS, ROUTINE W REFLEX MICROSCOPIC
Bilirubin Urine: NEGATIVE
Glucose, UA: NEGATIVE mg/dL
Hgb urine dipstick: NEGATIVE
Ketones, ur: NEGATIVE mg/dL
Leukocytes,Ua: NEGATIVE
Nitrite: NEGATIVE
Protein, ur: NEGATIVE mg/dL
Specific Gravity, Urine: 1.014 (ref 1.005–1.030)
pH: 5 (ref 5.0–8.0)

## 2020-10-18 LAB — COMPREHENSIVE METABOLIC PANEL
ALT: 28 U/L (ref 0–44)
AST: 29 U/L (ref 15–41)
Albumin: 4 g/dL (ref 3.5–5.0)
Alkaline Phosphatase: 58 U/L (ref 38–126)
Anion gap: 7 (ref 5–15)
BUN: 14 mg/dL (ref 8–23)
CO2: 26 mmol/L (ref 22–32)
Calcium: 9 mg/dL (ref 8.9–10.3)
Chloride: 106 mmol/L (ref 98–111)
Creatinine, Ser: 0.92 mg/dL (ref 0.61–1.24)
GFR, Estimated: >60 mL/min (ref >60)
Glucose, Bld: 117 mg/dL — ABNORMAL HIGH (ref 70–99)
Potassium: 3.8 mmol/L (ref 3.5–5.1)
Sodium: 139 mmol/L (ref 135–145)
Total Bilirubin: 1.1 mg/dL (ref 0.3–1.2)
Total Protein: 6.8 g/dL (ref 6.5–8.1)

## 2020-10-18 LAB — LIPASE, BLOOD: Lipase: 33 U/L (ref 11–51)

## 2020-10-18 MED ORDER — IOHEXOL 300 MG/ML  SOLN
75.0000 mL | Freq: Once | INTRAMUSCULAR | Status: AC | PRN
  Administered 2020-10-18: 75 mL via INTRAVENOUS

## 2020-10-18 NOTE — ED Triage Notes (Signed)
Pt endorses bilateral side pain since yesterday. Endorses some pain after voiding. Pain goes down both legs.

## 2020-10-18 NOTE — ED Provider Notes (Signed)
Emergency Medicine Provider Triage Evaluation Note  Chris Young , a 65 y.o. male  was evaluated in triage.  Pt complains of flank pain.  Began yesterday.)  Bilateral flanks and extends down his bilateral legs.  No midline back pain.  Has some dysuria after voiding.  No recent trauma or injuries.  No chest pain or shortness of breath.  No midline abdominal pain.  No prior history of AAA or dissection.  No bowel or bladder incontinence, saddle paresthesia.  He is unsure if he has had any hematuria.  Review of Systems  Positive: Abd pain Negative: Chest pain, shortness of breath, paresthesias or weakness  Physical Exam  BP (!) 152/84   Pulse 81   Temp 98.9 F (37.2 C) (Oral)   Resp 16   SpO2 99%  Gen:   Awake, no distress   Resp:  Normal effort  Cardiac: 2+ radial pulses bilaterally MSK:   Moves extremities without difficulty, no midline spinal tenderness ABD:  Diffuse tenderness of bilateral flanks and lateral aspect abdomen.  No midline pulsatile abdominal mass. Other:    Medical Decision Making  Medically screening exam initiated at 7:10 PM.  Appropriate orders placed.  Chris Young was informed that the remainder of the evaluation will be completed by another provider, this initial triage assessment does not replace that evaluation, and the importance of remaining in the ED until their evaluation is complete.  Abdominal pain, flank pain   Crystle Carelli A, PA-C 10/18/20 1911    Benjiman Core, MD 10/18/20 [phone]

## 2020-10-19 MED ORDER — METHOCARBAMOL 500 MG PO TABS
500.0000 mg | ORAL_TABLET | Freq: Three times a day (TID) | ORAL | 0 refills | Status: DC | PRN
Start: 2020-10-19 — End: 2021-03-27

## 2020-10-19 MED ORDER — LIDOCAINE 4 % EX PTCH
1.0000 | MEDICATED_PATCH | Freq: Two times a day (BID) | CUTANEOUS | 0 refills | Status: DC | PRN
Start: 2020-10-19 — End: 2021-03-27

## 2020-10-19 NOTE — ED Notes (Signed)
Interpreter # [phone]

## 2020-10-19 NOTE — ED Provider Notes (Signed)
[facility] EMERGENCY DEPARTMENT Provider Note   CSN: [id number] Arrival date & time: 10/18/20  1854     History No chief complaint on file.   Chris Young is a 65 y.o. male with a history of GERD and elevated blood pressure.  Patient presents emerged part with a chief complaint of bilateral flank and low back pain.  Patient reports that he has had this pain intermittently over the last "couple months."  Pain has become worse yesterday.  Patient rates pain 10/10 on the pain scale.  Patient reports the pain is worse when sleeping or walking.  Patient has tried any modalities to alleviate his pain.  Patient is unable to characterize this pain but states it "feels like something is moving inside of me."  Pain radiates to bilateral lower legs.  Patient denies any recent falls or injuries.  Patient is not on any blood thinners.  Patient denies any IV drug use, smoking, or history of cancer.   Patient denies any unexpected weight loss, bowel or bladder dysfunction, saddle anesthesia, numbness, weakness, facial asymmetry, slurred speech, headache, visual disturbance, fevers, chills, chest pain, shortness of breath.  Patient endorses occasional mild dysuria.  Denies any hematuria, penile discharge, genital sores or lesions, tenderness to genitals, swelling or rash to genitals, abdominal pain, nausea, vomiting, diarrhea.  HPI     Past Medical History:  Diagnosis Date   Elevated blood pressure    GERD (gastroesophageal reflux disease) 09/04/2014   History of cataract     Patient Active Problem List   Diagnosis Date Noted   GERD (gastroesophageal reflux disease) 09/04/2014    Past Surgical History:  Procedure Laterality Date   EYE SURGERY     cataract       Family History  Problem Relation Age of Onset   Stomach cancer Father     Social History   Tobacco Use   Smoking status: Never  Substance Use Topics   Alcohol use: No    Alcohol/week: 0.0  standard drinks   Drug use: No    Home Medications Prior to Admission medications   Medication Sig Start Date End Date Taking? Authorizing Provider  acetaminophen (TYLENOL) 500 MG tablet Take 1 tablet (500 mg total) by mouth every 6 (six) hours as needed. For hand pain. 07/18/14   Ladona Mow, PA-C  benzonatate (TESSALON) 100 MG capsule Take 1 capsule (100 mg total) by mouth every 8 (eight) hours. 07/18/14   Ladona Mow, PA-C  dicyclomine (BENTYL) 20 MG tablet Take 1 tablet (20 mg total) by mouth 2 (two) times daily. 09/02/11 09/01/12  Chilton Si, PA-C  ibuprofen (ADVIL,MOTRIN) 200 MG tablet Take 200-400 mg by mouth every 6 (six) hours as needed. As needed for occasional headaches.    [provider]  OVER THE COUNTER MEDICATION Take 1 tablet by mouth at bedtime. Allergy medication for runny nose and itching.    [provider]    Allergies    Patient has no known allergies.  Review of Systems   Review of Systems  Constitutional:  Negative for chills and fever.  Eyes:  Negative for visual disturbance.  Respiratory:  Negative for shortness of breath.   Cardiovascular:  Negative for chest pain.  Gastrointestinal:  Negative for abdominal pain, nausea and vomiting.  Genitourinary:  Positive for dysuria and flank pain. Negative for difficulty urinating, frequency, hematuria, penile discharge, penile swelling, scrotal swelling and testicular pain.  Musculoskeletal:  Positive for back pain and myalgias. Negative  for arthralgias, gait problem, neck pain and neck stiffness.  Skin:  Negative for color change, pallor, rash and wound.  Neurological:  Negative for dizziness, tremors, seizures, syncope, facial asymmetry, speech difficulty, weakness, light-headedness, numbness and headaches.  Psychiatric/Behavioral:  Negative for confusion.    Physical Exam Updated Vital Signs BP 139/76 (BP Location: Left Arm)   Pulse 78   Temp 97.7 F (36.5 C) (Oral)   Resp 20   SpO2 99%    Physical Exam Vitals and nursing note reviewed.  Constitutional:      General: He is not in acute distress.    Appearance: He is not ill-appearing, toxic-appearing or diaphoretic.  HENT:     Head: Normocephalic and atraumatic. No raccoon eyes, Battle's sign, abrasion, contusion, masses, right periorbital erythema, left periorbital erythema or laceration.     Mouth/Throat:     Pharynx: Oropharynx is clear. Uvula midline. No pharyngeal swelling, oropharyngeal exudate, posterior oropharyngeal erythema or uvula swelling.  Eyes:     General: No scleral icterus.       Right eye: No discharge.        Left eye: No discharge.     Extraocular Movements: Extraocular movements intact.     Conjunctiva/sclera:     Right eye: Right conjunctiva is not injected. No chemosis, exudate or hemorrhage.    Left eye: Left conjunctiva is not injected. No chemosis, exudate or hemorrhage.    Pupils: Pupils are equal, round, and reactive to light.  Cardiovascular:     Rate and Rhythm: Normal rate.     Pulses:          Dorsalis pedis pulses are 2+ on the right side and 2+ on the left side.  Pulmonary:     Effort: Pulmonary effort is normal. No tachypnea, bradypnea or respiratory distress.     Breath sounds: Normal breath sounds. No stridor.  Abdominal:     General: Abdomen is flat. Bowel sounds are normal. There is no distension. There are no signs of injury.     Palpations: Abdomen is soft. There is no mass or pulsatile mass.     Tenderness: There is no abdominal tenderness. There is no right CVA tenderness, left CVA tenderness, guarding or rebound.     Hernia: There is no hernia in the umbilical area or ventral area.  Musculoskeletal:     Cervical back: Normal range of motion and neck supple. No swelling, edema, deformity, erythema, signs of trauma, lacerations, rigidity, spasms, torticollis, tenderness, bony tenderness or crepitus. No pain with movement. Normal range of motion.     Thoracic back: No  swelling, edema, deformity, signs of trauma, lacerations, spasms, tenderness or bony tenderness. Normal range of motion.     Lumbar back: No swelling, edema, deformity, signs of trauma, lacerations, spasms, tenderness or bony tenderness. Normal range of motion. Negative right straight leg raise test and negative left straight leg raise test.  Skin:    General: Skin is warm and dry.  Neurological:     General: No focal deficit present.     Mental Status: He is alert.     GCS: GCS eye subscore is 4. GCS verbal subscore is 5. GCS motor subscore is 6.     Cranial Nerves: No cranial nerve deficit or facial asymmetry.     Sensory: Sensation is intact.     Motor: No weakness, tremor, seizure activity or pronator drift.     Coordination: Romberg sign negative. Finger-Nose-Finger Test normal.     Gait: Gait  is intact. Gait normal.     Comments: CN II-XII intact, equal grip strength, +5 strength to bilateral upper and lower extremities, sensation to light touch intact to bilateral upper and lower extremities  Gait intact without any abnormalities or difficulty  Psychiatric:        Behavior: Behavior is cooperative.    ED Results / Procedures / Treatments   Labs (all labs ordered are listed, but only abnormal results are displayed) Labs Reviewed  COMPREHENSIVE METABOLIC PANEL - Abnormal; Notable for the following components:      Result Value   Glucose, Bld 117 (*)    All other components within normal limits  CBC WITH DIFFERENTIAL/PLATELET  LIPASE, BLOOD  URINALYSIS, ROUTINE W REFLEX MICROSCOPIC    EKG None  Radiology CT Abdomen Pelvis W Contrast  Result Date: 10/18/2020 CLINICAL DATA:  Abdominal pain. EXAM: CT ABDOMEN AND PELVIS WITH CONTRAST TECHNIQUE: Multidetector CT imaging of the abdomen and pelvis was performed using the standard protocol following bolus administration of intravenous contrast. CONTRAST:  75mL OMNIPAQUE IOHEXOL 300 MG/ML  SOLN COMPARISON:  September 02, 2011 FINDINGS:  Lower chest: No acute abnormality. Hepatobiliary: No liver masses identified. Mild intrahepatic ductal prominence, stable since 2013, of no acute significance. The portal vein is patent. The gallbladder is poorly distended but grossly unremarkable. No other abnormalities. Pancreas: Unremarkable. No pancreatic ductal dilatation or surrounding inflammatory changes. Spleen: Normal in size without focal abnormality. Adrenals/Urinary Tract: Adrenal glands are unremarkable. Kidneys are normal, without renal calculi, focal lesion, or hydronephrosis. Bladder is unremarkable. Stomach/Bowel: Stomach is within normal limits. Appendix appears normal. No evidence of bowel wall thickening, distention, or inflammatory changes. Vascular/Lymphatic: No significant vascular findings are present. No enlarged abdominal or pelvic lymph nodes. Reproductive: Prostate is unremarkable. Other: No abdominal wall hernia or abnormality. No abdominopelvic ascites. Musculoskeletal: No acute or significant osseous findings. IMPRESSION: 1. No cause for the patient's symptoms identified. No acute abnormalities. Electronically Signed   By: Gerome Samavid  Williams III M.D   On: 10/18/2020 21:31    Procedures Procedures   Medications Ordered in ED Medications  iohexol (OMNIPAQUE) 300 MG/ML solution 75 mL (75 mLs Intravenous Contrast Given 10/18/20 2102)    ED Course  I have reviewed the triage vital signs and the nursing notes.  Pertinent labs & imaging results that were available during my care of the patient were reviewed by me and considered in my medical decision making (see chart for details).    MDM Rules/Calculators/A&P                           Alert 65 year old male no acute distress, nontoxic-appearing.  Patient presents to the emergency department with a chief complaint of bilateral flank and lumbar back pain that radiates into the lower legs.  Patient reports that symptoms have been intermittent over the last "couple months."   Pain has become worse yesterday.  Patient denies any recent falls or injuries.  CMP, CBC, lipase, urinalysis, CT abdomen pelvis with contrast were obtained while patient was in triage.  CMP, CBC, lipase, UA are unremarkable. CT abdomen pelvis shows no acute abnormalities in abdomen or pelvis including no significant osseous findings and AAA.  Low suspicion for cauda equina syndrome or epidural abscess as patient has no bowel or bladder dysfunction, saddle anesthesia, numbness, weakness.  Patient denies any IV drug use.  Patient has no focal neurological deficits on physical exam.  Negative straight leg raise bilaterally, low suspicion for sciatica  at this time.  Patient on imaging, lab findings and physical exam suspect that patient's symptoms are musculoskeletal in nature.  We will give patient prescription of Robaxin and lidocaine patch.  Patient advised to use over-the-counter pain medication as needed.  We will have patient follow-up with his primary care provider if his symptoms not proved.  During his ED stay patient was noted to have hypertension.  Patient is not currently on any medication for hypertension.  On reassessment blood pressure 139/76.  Patient denied any chest pain, shortness of breath, visual disturbance, or headache.    We will have patient follow-up with his primary care provider for reassessment of his blood pressure.  Patient was also advised to check his blood pressure at home prior to this appointment.  Discussed results, findings, treatment and follow up. Patient advised of return precautions. Patient verbalized understanding and agreed with plan.   Final Clinical Impression(s) / ED Diagnoses Final diagnoses:  Flank pain  Acute bilateral low back pain, unspecified whether sciatica present  Hypertension, unspecified type    Rx / DC Orders ED Discharge Orders          Ordered    methocarbamol (ROBAXIN) 500 MG tablet  Every 8 hours PRN        10/19/20 0736     Lidocaine (HM LIDOCAINE PATCH) 4 % PTCH  Every 12 hours PRN        10/19/20 0757             Haskel Schroeder, PA-C 10/19/20 0759    Horton, Clabe Seal, DO 10/20/20 0720

## 2020-10-19 NOTE — Discharge Instructions (Addendum)
You came to the emergency department today to be evaluated for your back and side pain.  Your physical exam was very reassuring.  The CT scan of your abdomen and pelvis showed no abnormalities.  Your lab work was unremarkable.  Based on these findings I believe that your symptoms are due to muscle pain.  I have given you a prescription for Robaxin.  You may take 1 pill every 8 hours as needed.  You may also take Tylenol and ibuprofen to help with your pain.  If your symptoms do not improve please follow-up with your primary care provider.  Additionally I want you to follow-up with your primary care provider because your blood pressure was elevated while you were in the emergency department and you need to have your blood pressure rechecked.  If you have a blood pressure cuff at home please check your blood pressure 1-2 times daily while sitting at rest and without any caffeine in your system for at least 1 hour.  Today you were prescribed Methocarbamol (Robaxin).  Methocarbamol (Robaxin) is used to treat muscle spasms/pain.  It works by helping to relax the muscles.  Drowsiness, dizziness, lightheadedness, stomach upset, nausea/vomiting, or blurred vision may occur.  Do not drive, use machinery, or do anything that needs alertness or clear vision until you can do it safely.  Do not combine this medication with alcoholic beverages, marijuana, or other central nervous system depressants.    Please take Ibuprofen (Advil, motrin) and Tylenol (acetaminophen) to relieve your pain.    You may take up to 600 MG (3 pills) of normal strength ibuprofen every 8 hours as needed.   You make take tylenol, up to 1,000 mg (two extra strength pills) every 8 hours as needed.   It is safe to take ibuprofen and tylenol at the same time as they work differently.   Do not take more than 3,000 mg tylenol in a 24 hour period (not more than one dose every 8 hours.  Please check all medication labels as many medications such as pain  and cold medications may contain tylenol.  Do not drink alcohol while taking these medications.  Do not take other NSAID'S while taking ibuprofen (such as aleve or naproxen).  Please take ibuprofen with food to decrease stomach upset.  Get help right away if: You develop new bowel or bladder control problems. You have unusual weakness or numbness in your arms or legs. You develop nausea or vomiting. You develop abdominal pain. You feel faint.

## 2021-03-26 NOTE — Progress Notes (Signed)
Patient referred by Carol Ada, MD for chest pain, CAD  Subjective:   Chris Young, male    DOB: 25-Mar-1956, 65 y.o.   MRN: [id number]   Chief Complaint  Patient presents with   Coronary Artery Disease   New Patient (Initial Visit)    HPI  65 y.o. African American male with hypertension, hyperlipidemia, GERD, CAD  Patient was last seen in our office in 2016. He had negative exercise treadmill stress test then. He is now referred for chest pain after recent cardiac workup in [place]. 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 He reports similar symptoms several years ago as well, but have clearly increased recently. He was seen by Dr. Benson Norway, but was felt to have cardiac symptoms and was referred to me.   Patient has strong family h/o CAD, with his brother having had MI, requiring CABG in 51s. Patient himself underwent workup in [place], details below.   He was on Aspirin 100, candesartan 8, atorvastatin 40 in [place]. In the [place], he is now on Aspirin 81, losartan 25, atorvastatin 40.    Past Medical History:  Diagnosis Date   Elevated blood pressure    GERD (gastroesophageal reflux disease) 09/04/2014   History of cataract      Past Surgical History:  Procedure Laterality Date   EYE SURGERY     cataract     Social History   Tobacco Use  Smoking Status Never  Smokeless Tobacco Not on file    Social History   Substance and Sexual Activity  Alcohol Use No   Alcohol/week: 0.0 standard drinks     Family History  Problem Relation Age of Onset   Stomach cancer Father      Current Outpatient Medications on File Prior to Visit  Medication Sig Dispense Refill   acetaminophen (TYLENOL) 500 MG tablet Take 1 tablet (500 mg total) by mouth every 6 (six) hours as needed. For hand pain. (Patient not taking: Reported on 10/19/2020) 30 tablet 0   atorvastatin (LIPITOR) 20 MG  tablet Take 1 tablet by mouth daily.     benzonatate (TESSALON) 100 MG capsule Take 1 capsule (100 mg total) by mouth every 8 (eight) hours. (Patient not taking: Reported on 10/19/2020) 21 capsule 0   dicyclomine (BENTYL) 20 MG tablet Take 1 tablet (20 mg total) by mouth 2 (two) times daily. (Patient not taking: Reported on 10/19/2020) 40 tablet 0   ibuprofen (ADVIL,MOTRIN) 200 MG tablet Take 200-400 mg by mouth every 6 (six) hours as needed. As needed for occasional headaches.     Lidocaine (HM LIDOCAINE PATCH) 4 % PTCH Apply 1 patch topically every 12 (twelve) hours as needed. 30 patch 0   methocarbamol (ROBAXIN) 500 MG tablet Take 1 tablet (500 mg total) by mouth every 8 (eight) hours as needed for muscle spasms. 20 tablet 0   tadalafil (CIALIS) 5 MG tablet Take 5 mg by mouth daily.     No current facility-administered medications on file prior to visit.    Cardiovascular and other pertinent studies:  Coronary CT angiogram 11/2020 (performed in [place]): Calcium score: Total: 716, 75-90th percentile LM: 79 LAD: 473 Lcx: 162 RCA:  2  LM: No significant stenosis LAD: Proximal LAD calcific 67% stenosis, 12.5 mm from LAD origin Lcx: Proximal 87% calcific stenosis RCA: No significant disease  EKG 03/27/2021: Sinus rhythm 66 bpm  Incomplete RBBB  No results found  for this or any previous visit from the past 1095 days.     Recent labs: 01/21/2021: Glucose 88, BUN/Cr 13/0.88. EGFR 96. Na/K 140/4.3. Rest of the CMP normal H/H 15.2/46.7. MCV 91.9. Platelets 183 HbA1C  5.4% Chol 122, TG 75, HDL 41, LDL 65   Review of Systems  Cardiovascular:  Positive for chest pain. Negative for dyspnea on exertion, leg swelling, palpitations and syncope.        Vitals:   03/27/21 0933 03/27/21 0945  BP: (!) 151/78 (!) 150/80  Pulse: 66 66  Resp: 16   Temp: (!) 97.4 F (36.3 C)   SpO2: 98%      Body mass index is 23.96 kg/m. Filed Weights   03/27/21 0933  Weight: 167 lb (75.8 kg)      Objective:   Physical Exam Vitals and nursing note reviewed.  Constitutional:      General: He is not in acute distress. Neck:     Vascular: No JVD.  Cardiovascular:     Rate and Rhythm: Normal rate and regular rhythm.     Heart sounds: Normal heart sounds. No murmur heard. Pulmonary:     Effort: Pulmonary effort is normal.     Breath sounds: Normal breath sounds. No wheezing or rales.  Musculoskeletal:     Right lower leg: No edema.     Left lower leg: No edema.         Assessment & Recommendations:   64 y.o. African American male with hypertension, hyperlipidemia, GERD, CAD  CAD: Obstructive CAD seen on CT coronary angiogram 11/2020 performed in [place], with moderate proximal LAD, and severe proximal left circumflex calcific stenosis. Recommend aggressive medical therapy. Continue aspirin 81 mg daily, atorvastatin 40 mg daily.  Lipids well controlled. Added metoprolol succinate 25 mg daily, and isosorbide mononitrate 30 mg daily. Continue losartan 25 mg daily. Also prescribed sublingual nitroglycerin for as needed use. Close follow-up in 2 weeks. He prefers medical or percutaneous treatment, unlike CABG that his brother underwent recently. If symptoms do not improve on current antianginal therapy, recommend coronary angiography and intervention to left circumflex, and FFR/IFR guided intervention to LAD.  Patient will likely need atherectomy given severe calcifications.  Hypertension: As above   Thank you for referring the patient to [place]. Please feel free to contact with any questions.   Nigel Mormon, MD Pager: [phone] Office: [phone]

## 2021-03-27 ENCOUNTER — Other Ambulatory Visit: Payer: Self-pay

## 2021-03-27 ENCOUNTER — Encounter: Payer: Self-pay | Admitting: Cardiology

## 2021-03-27 ENCOUNTER — Ambulatory Visit: Payer: 59 | Admitting: Cardiology

## 2021-03-27 VITALS — BP 150/80 | HR 66 | Temp 97.4°F | Resp 16 | Ht 70.0 in | Wt 167.0 lb

## 2021-03-27 DIAGNOSIS — I10 Essential (primary) hypertension: Secondary | ICD-10-CM

## 2021-03-27 DIAGNOSIS — I25118 Atherosclerotic heart disease of native coronary artery with other forms of angina pectoris: Secondary | ICD-10-CM | POA: Insufficient documentation

## 2021-03-27 DIAGNOSIS — I2510 Atherosclerotic heart disease of native coronary artery without angina pectoris: Secondary | ICD-10-CM | POA: Insufficient documentation

## 2021-03-27 HISTORY — DX: Essential (primary) hypertension: I10

## 2021-03-27 MED ORDER — NITROGLYCERIN 0.4 MG SL SUBL: 0.4000 mg | SUBLINGUAL_TABLET | SUBLINGUAL | 3 refills | Status: DC | PRN

## 2021-03-27 MED ORDER — METOPROLOL SUCCINATE ER 25 MG PO TB24: 25.0000 mg | ORAL_TABLET | Freq: Every day | ORAL | 2 refills | Status: DC

## 2021-03-27 MED ORDER — ISOSORBIDE MONONITRATE ER 30 MG PO TB24: 30.0000 mg | ORAL_TABLET | Freq: Every day | ORAL | 3 refills | Status: DC

## 2021-03-30 ENCOUNTER — Telehealth: Payer: Self-pay

## 2021-04-01 NOTE — Telephone Encounter (Signed)
Spoke with the patient. Angina is improved, but has some lightheadedness and headache. Headache is due to Imdur, tylenol should help. Stop losartan, that should help lightheadedness.  ? Elder Negus, MD Pager: [phone] Office: [phone]

## 2021-04-10 ENCOUNTER — Other Ambulatory Visit: Payer: Self-pay

## 2021-04-10 ENCOUNTER — Ambulatory Visit: Payer: 59 | Admitting: Cardiology

## 2021-04-10 ENCOUNTER — Encounter: Payer: Self-pay | Admitting: Cardiology

## 2021-04-10 VITALS — BP 139/76 | HR 63 | Temp 97.9°F | Ht 70.0 in | Wt 158.0 lb

## 2021-04-10 DIAGNOSIS — I10 Essential (primary) hypertension: Secondary | ICD-10-CM

## 2021-04-10 DIAGNOSIS — I25118 Atherosclerotic heart disease of native coronary artery with other forms of angina pectoris: Secondary | ICD-10-CM

## 2021-04-10 NOTE — Progress Notes (Signed)
Patient referred by Trey Sailors, PA for chest pain, CAD  Subjective:   Chris Young, male    DOB: 18-Jun-1955, 65 y.o.   MRN: [id number]   Chief Complaint  Patient presents with   Coronary Artery Disease   Follow-up    HPI  65 y.o. African American male with hypertension, hyperlipidemia, GERD, CAD  Patient is an improvement with his chest pain symptoms after starting metoprolol and Imdur.  However, still has episodes from time to time.  What remains perplexing, as the patient reports that these symptoms have been occurring for 50 years and remained constant 24/7.  He is reluctant to undergo  Initial consultation visit 03/2021: Patient was last seen in our office in 2016. He had negative exercise treadmill stress test then. He is now referred for chest pain after recent cardiac workup in [place]. 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 He reports similar symptoms several years ago as well, but have clearly increased recently. He was seen by Dr. Benson Norway, but was felt to have cardiac symptoms and was referred to me.   Patient has strong family h/o CAD, with his brother having had MI, requiring CABG in 19s. Patient himself underwent workup in [place], details below.   He was on Aspirin 100, candesartan 8, atorvastatin 40 in [place]. In the [place], he is now on Aspirin 81, losartan 25, atorvastatin 40.   Current Outpatient Medications on File Prior to Visit  Medication Sig Dispense Refill   aspirin 81 MG chewable tablet Chew 81 mg by mouth daily.     atorvastatin (LIPITOR) 40 MG tablet Take 40 mg by mouth daily.     ibuprofen (ADVIL,MOTRIN) 200 MG tablet Take 200-400 mg by mouth every 6 (six) hours as needed. As needed for occasional headaches.     isosorbide mononitrate (IMDUR) 30 MG 24 hr tablet Take 1 tablet (30 mg total) by mouth daily. 90 tablet 3   metoprolol succinate (TOPROL XL) 25  MG 24 hr tablet Take 1 tablet (25 mg total) by mouth daily. 90 tablet 2   nitroGLYCERIN (NITROSTAT) 0.4 MG SL tablet Place 1 tablet (0.4 mg total) under the tongue every 5 (five) minutes as needed for chest pain. 30 tablet 3   losartan (COZAAR) 25 MG tablet Take 25 mg by mouth daily. (Patient not taking: Reported on 04/10/2021)     No current facility-administered medications on file prior to visit.    Cardiovascular and other pertinent studies:  Coronary CT angiogram 11/2020 (performed in [place]): Calcium score: Total: 716, 75-90th percentile LM: 79 LAD: 473 Lcx: 162 RCA:  2  LM: No significant stenosis LAD: Proximal LAD calcific 67% stenosis, 12.5 mm from LAD origin Lcx: Proximal 87% calcific stenosis RCA: No significant disease  EKG 03/27/2021: Sinus rhythm 66 bpm  Incomplete RBBB  No results found for this or any previous visit from the past 1095 days.     Recent labs: 01/21/2021: Glucose 88, BUN/Cr 13/0.88. EGFR 96. Na/K 140/4.3. Rest of the CMP normal H/H 15.2/46.7. MCV 91.9. Platelets 183 HbA1C  5.4% Chol 122, TG 75, HDL 41, LDL 65   Review of Systems  Cardiovascular:  Positive for chest pain. Negative for dyspnea on exertion, leg swelling, palpitations and syncope.        Vitals:   04/10/21 1326  BP: 139/76  Pulse: 63  Temp: 97.9 F (36.6 C)  SpO2: 99%  Body mass index is 22.67 kg/m. Filed Weights   04/10/21 1326  Weight: 158 lb (71.7 kg)     Objective:   Physical Exam Vitals and nursing note reviewed.  Constitutional:      General: He is not in acute distress. Neck:     Vascular: No JVD.  Cardiovascular:     Rate and Rhythm: Normal rate and regular rhythm.     Heart sounds: Normal heart sounds. No murmur heard. Pulmonary:     Effort: Pulmonary effort is normal.     Breath sounds: Normal breath sounds. No wheezing or rales.  Musculoskeletal:     Right lower leg: No edema.     Left lower leg: No edema.         Assessment &  Recommendations:   65 y.o. African American male with hypertension, hyperlipidemia, GERD, CAD  CAD: Obstructive CAD seen on CT coronary angiogram 11/2020 performed in [place], with moderate proximal LAD, and severe proximal left circumflex calcific stenosis. However, is a 24/7 chest pain, reportedly present for 50 years, is certainly not due to the above findings. He is reluctant to consider invasive work-up. I recommend exercise nuclear stress test to assess his exercise capacity, and ischemia.  This will help in further decision-making. Continue aspirin 81 mg daily, atorvastatin 40 mg daily, metoprolol succinate 25 mg daily, and isosorbide mononitrate 30 mg daily. Also prescribed sublingual nitroglycerin for as needed use.  Hypertension: As above  F/u after stress test   Nigel Mormon, MD Pager: [phone] Office: [phone]

## 2021-04-14 ENCOUNTER — Ambulatory Visit: Payer: 59

## 2021-04-14 ENCOUNTER — Other Ambulatory Visit: Payer: Self-pay

## 2021-04-14 DIAGNOSIS — I25118 Atherosclerotic heart disease of native coronary artery with other forms of angina pectoris: Secondary | ICD-10-CM

## 2021-05-20 ENCOUNTER — Ambulatory Visit: Payer: Medicaid Other | Admitting: Cardiology

## 2021-05-22 ENCOUNTER — Ambulatory Visit: Payer: Medicaid Other | Admitting: Cardiology

## 2021-05-22 ENCOUNTER — Other Ambulatory Visit: Payer: Self-pay

## 2021-05-22 ENCOUNTER — Encounter: Payer: Self-pay | Admitting: Cardiology

## 2021-05-22 VITALS — BP 143/76 | HR 58 | Temp 98.0°F | Resp 16 | Ht 70.0 in | Wt 161.0 lb

## 2021-05-22 DIAGNOSIS — I10 Essential (primary) hypertension: Secondary | ICD-10-CM

## 2021-05-22 DIAGNOSIS — I25118 Atherosclerotic heart disease of native coronary artery with other forms of angina pectoris: Secondary | ICD-10-CM

## 2021-05-22 MED ORDER — ASPIRIN 81 MG PO CHEW: 81.0000 mg | CHEWABLE_TABLET | Freq: Every day | ORAL | 3 refills | Status: DC

## 2021-05-22 MED ORDER — METOPROLOL SUCCINATE ER 25 MG PO TB24: 25.0000 mg | ORAL_TABLET | Freq: Every day | ORAL | 3 refills | Status: DC

## 2021-05-22 MED ORDER — ATORVASTATIN CALCIUM 40 MG PO TABS: 40.0000 mg | ORAL_TABLET | Freq: Every day | ORAL | 3 refills | Status: DC

## 2021-05-22 NOTE — Progress Notes (Signed)
Patient referred by Trey Sailors, PA for chest pain, CAD  Subjective:   Chris Young, male    DOB: 10/03/1955, 66 y.o.   MRN: [id number]   Chief Complaint  Patient presents with   Coronary Artery Disease   Follow-up   Results    HPI  66 y.o. African American male with hypertension, hyperlipidemia, GERD, CAD  Patient's long standing chest pain has improved on current medical therapy. He stopped Imdur due to headache. Blood pressure usually well controlled, mildly elevated in the office today.  Initial consultation visit 03/2021: Patient was last seen in our office in 2016. He had negative exercise treadmill stress test then. He is now referred for chest pain after recent cardiac workup in [place]. 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 He reports similar symptoms several years ago as well, but have clearly increased recently. He was seen by Dr. Benson Norway, but was felt to have cardiac symptoms and was referred to me.   Patient has strong family h/o CAD, with his brother having had MI, requiring CABG in 54s. Patient himself underwent workup in [place], details below.   He was on Aspirin 100, candesartan 8, atorvastatin 40 in [place]. In the [place], he is now on Aspirin 81, losartan 25, atorvastatin 40.   Current Outpatient Medications on File Prior to Visit  Medication Sig Dispense Refill   aspirin 81 MG chewable tablet Chew 81 mg by mouth daily.     atorvastatin (LIPITOR) 40 MG tablet Take 40 mg by mouth daily.     ibuprofen (ADVIL,MOTRIN) 200 MG tablet Take 200-400 mg by mouth every 6 (six) hours as needed. As needed for occasional headaches.     isosorbide mononitrate (IMDUR) 30 MG 24 hr tablet Take 1 tablet (30 mg total) by mouth daily. 90 tablet 3   losartan (COZAAR) 25 MG tablet Take 25 mg by mouth daily. (Patient not taking: Reported on 04/10/2021)     metoprolol succinate (TOPROL  XL) 25 MG 24 hr tablet Take 1 tablet (25 mg total) by mouth daily. 90 tablet 2   nitroGLYCERIN (NITROSTAT) 0.4 MG SL tablet Place 1 tablet (0.4 mg total) under the tongue every 5 (five) minutes as needed for chest pain. 30 tablet 3   No current facility-administered medications on file prior to visit.    Cardiovascular and other pertinent studies:  Lexiscan (Walking with modified Bruce protocol) tetrofosmin stress test 04/14/2021: 1 Day Rest/Stress Protocol. Lexiscan (Walking with mod Bruce)Tetrofosmin Stress Test: Stress ECG equivocal for ischemia - upsloping ST depressions which resolved after 2 minutes into recovery. Normal myocardial perfusion without convincing evidence of reversible myocardial ischemia or prior infarct. Normal LV size and wall thickness. Calculated LVEF 61%.  Low risk study. No prior studies for comparison.    Coronary CT angiogram 11/2020 (performed in [place]): Calcium score: Total: 716, 75-90th percentile LM: 79 LAD: 473 Lcx: 162 RCA:  2  LM: No significant stenosis LAD: Proximal LAD calcific 67% stenosis, 12.5 mm from LAD origin Lcx: Proximal 87% calcific stenosis RCA: No significant disease  EKG 03/27/2021: Sinus rhythm 66 bpm  Incomplete RBBB  Recent labs: 01/21/2021: Glucose 88, BUN/Cr 13/0.88. EGFR 96. Na/K 140/4.3. Rest of the CMP normal H/H 15.2/46.7. MCV 91.9. Platelets 183 HbA1C  5.4% Chol 122, TG 75, HDL 41, LDL 65   Review of Systems  Cardiovascular:  Positive for chest pain (Improved). Negative for dyspnea on  exertion, leg swelling, palpitations and syncope.        Vitals:   05/22/21 1409  BP: (!) 143/76  Pulse: (!) 58  Resp: 16  Temp: 98 F (36.7 C)  SpO2: 96%     Body mass index is 23.1 kg/m. Filed Weights   05/22/21 1409  Weight: 161 lb (73 kg)     Objective:   Physical Exam Vitals and nursing note reviewed.  Constitutional:      General: He is not in acute distress. Neck:     Vascular: No JVD.   Cardiovascular:     Rate and Rhythm: Normal rate and regular rhythm.     Heart sounds: Normal heart sounds. No murmur heard. Pulmonary:     Effort: Pulmonary effort is normal.     Breath sounds: Normal breath sounds. No wheezing or rales.  Musculoskeletal:     Right lower leg: No edema.     Left lower leg: No edema.         Assessment & Recommendations:   66 y.o. African American male with hypertension, hyperlipidemia, GERD, CAD  CAD: Obstructive CAD seen on CT coronary angiogram 11/2020 performed in [place], with moderate proximal LAD, and severe proximal left circumflex calcific stenosis. However, no ischemia on stress testing (04/2021) His 24X7 chest pain unlikely to be angina. Continue medical management with Aspirin 55m daily, atorvastatin 40 mg daily, metoprolol succinate 25 mg daily  Hypertension: Fairly well controlled  F/u in 6 months   MNigel Mormon MD Pager: [phone]: [phone]

## 2021-05-29 ENCOUNTER — Ambulatory Visit: Payer: Medicaid Other | Admitting: Cardiology

## 2021-11-23 ENCOUNTER — Ambulatory Visit: Payer: Self-pay | Admitting: Cardiology

## 2021-11-23 NOTE — Progress Notes (Deleted)
Patient referred by Trey Sailors, PA for chest pain, CAD  Subjective:   Chris Young, male    DOB: November 22, 1955, 66 y.o.   MRN: [id number]   No chief complaint on file.   HPI  66 y.o. African American male with hypertension, hyperlipidemia, GERD, CAD  Patient's long standing chest pain has improved on current medical therapy. He stopped Imdur due to headache. Blood pressure usually well controlled, mildly elevated in the office today.  Initial consultation visit 03/2021: Patient was last seen in our office in 2016. He had negative exercise treadmill stress test then. He is now referred for chest pain after recent cardiac workup in [place]. 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 He reports similar symptoms several years ago as well, but have clearly increased recently. He was seen by Dr. Benson Norway, but was felt to have cardiac symptoms and was referred to me.   Patient has strong family h/o CAD, with his brother having had MI, requiring CABG in 51s. Patient himself underwent workup in [place], details below.   He was on Aspirin 100, candesartan 8, atorvastatin 40 in [place]. In the [place], he is now on Aspirin 81, losartan 25, atorvastatin 40.    Current Outpatient Medications:    aspirin 81 MG chewable tablet, Chew 1 tablet (81 mg total) by mouth daily., Disp: 90 tablet, Rfl: 3   atorvastatin (LIPITOR) 40 MG tablet, Take 1 tablet (40 mg total) by mouth daily., Disp: 90 tablet, Rfl: 3   ibuprofen (ADVIL,MOTRIN) 200 MG tablet, Take 200-400 mg by mouth every 6 (six) hours as needed. As needed for occasional headaches., Disp: , Rfl:    isosorbide mononitrate (IMDUR) 30 MG 24 hr tablet, Take 1 tablet (30 mg total) by mouth daily. (Patient not taking: Reported on 05/22/2021), Disp: 90 tablet, Rfl: 3   metoprolol succinate (TOPROL XL) 25 MG 24 hr tablet, Take 1 tablet (25 mg total) by mouth daily.,  Disp: 90 tablet, Rfl: 3   nitroGLYCERIN (NITROSTAT) 0.4 MG SL tablet, Place 1 tablet (0.4 mg total) under the tongue every 5 (five) minutes as needed for chest pain., Disp: 30 tablet, Rfl: 3  Cardiovascular and other pertinent studies:  Lexiscan (Walking with modified Bruce protocol) tetrofosmin stress test 04/14/2021: 1 Day Rest/Stress Protocol. Lexiscan (Walking with mod Bruce)Tetrofosmin Stress Test: Stress ECG equivocal for ischemia - upsloping ST depressions which resolved after 2 minutes into recovery. Normal myocardial perfusion without convincing evidence of reversible myocardial ischemia or prior infarct. Normal LV size and wall thickness. Calculated LVEF 61%.  Low risk study. No prior studies for comparison.    Coronary CT angiogram 11/2020 (performed in [place]): Calcium score: Total: 716, 75-90th percentile LM: 79 LAD: 473 Lcx: 162 RCA:  2  LM: No significant stenosis LAD: Proximal LAD calcific 67% stenosis, 12.5 mm from LAD origin Lcx: Proximal 87% calcific stenosis RCA: No significant disease  EKG 03/27/2021: Sinus rhythm 66 bpm  Incomplete RBBB  Recent labs: 01/21/2021: Glucose 88, BUN/Cr 13/0.88. EGFR 96. Na/K 140/4.3. Rest of the CMP normal H/H 15.2/46.7. MCV 91.9. Platelets 183 HbA1C  5.4% Chol 122, TG 75, HDL 41, LDL 65   Review of Systems  Cardiovascular:  Positive for chest pain (Improved). Negative for dyspnea on exertion, leg swelling, palpitations and syncope.         There were no vitals filed for this visit.    There is no height  or weight on file to calculate BMI. There were no vitals filed for this visit.    Objective:   Physical Exam Vitals and nursing note reviewed.  Constitutional:      General: He is not in acute distress. Neck:     Vascular: No JVD.  Cardiovascular:     Rate and Rhythm: Normal rate and regular rhythm.     Heart sounds: Normal heart sounds. No murmur heard. Pulmonary:     Effort: Pulmonary effort is  normal.     Breath sounds: Normal breath sounds. No wheezing or rales.  Musculoskeletal:     Right lower leg: No edema.     Left lower leg: No edema.          Assessment & Recommendations:   66 y.o. African American male with hypertension, hyperlipidemia, GERD, CAD  *** CAD: Obstructive CAD seen on CT coronary angiogram 11/2020 performed in [place], with moderate proximal LAD, and severe proximal left circumflex calcific stenosis. However, no ischemia on stress testing (04/2021) His 24X7 chest pain unlikely to be angina. Continue medical management with Aspirin 30m daily, atorvastatin 40 mg daily, metoprolol succinate 25 mg daily  Hypertension: Fairly well controlled  F/u in 6 months   MNigel Mormon MD Pager: [phone]: [phone]

## 2021-12-11 ENCOUNTER — Ambulatory Visit: Payer: Self-pay | Admitting: Cardiology

## 2021-12-11 ENCOUNTER — Encounter: Payer: Self-pay | Admitting: Cardiology

## 2021-12-11 VITALS — BP 145/72 | HR 71 | Resp 16 | Ht 70.0 in | Wt 162.0 lb

## 2021-12-11 DIAGNOSIS — I2510 Atherosclerotic heart disease of native coronary artery without angina pectoris: Secondary | ICD-10-CM

## 2021-12-11 DIAGNOSIS — I10 Essential (primary) hypertension: Secondary | ICD-10-CM

## 2021-12-11 MED ORDER — ATORVASTATIN CALCIUM 40 MG PO TABS: 40.0000 mg | ORAL_TABLET | Freq: Every day | ORAL | 3 refills | Status: DC

## 2021-12-11 MED ORDER — ASPIRIN 81 MG PO CHEW: 81.0000 mg | CHEWABLE_TABLET | Freq: Every day | ORAL | 3 refills | Status: DC

## 2021-12-11 MED ORDER — METOPROLOL SUCCINATE ER 25 MG PO TB24: 25.0000 mg | ORAL_TABLET | Freq: Every day | ORAL | 3 refills | Status: AC

## 2021-12-11 MED ORDER — LOSARTAN POTASSIUM 25 MG PO TABS: 25.0000 mg | ORAL_TABLET | Freq: Every day | ORAL | 3 refills | Status: DC

## 2021-12-11 NOTE — Progress Notes (Signed)
Patient referred by Trey Sailors, PA for chest pain, CAD  Subjective:   Chris Young, male    DOB: 01-23-1956, 66 y.o.   MRN: [id number]   Chief Complaint  Patient presents with   Coronary Artery Disease   Hypertension   Follow-up    HPI  66 y.o. African American male with hypertension, hyperlipidemia, GERD, CAD  Patient continues to have his longstanding chest pain, present since age 89, at all times.  This pain does not change with exertion.  He is able to continue his work at [employer], where he has to do a lot of physical activity.  He is taking all his medications, except metoprolol succinate.  He is not sure how he came off it.  On a separate note, he has tadalafill bottle with him, which he uses about 3 times a month.  Blood pressure elevated today, well controlled at home.  Initial consultation visit 03/2021: Patient was last seen in our office in 2016. He had negative exercise treadmill stress test then. He is now referred for chest pain after recent cardiac workup in [place]. 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 He reports similar symptoms several years ago as well, but have clearly increased recently. He was seen by Dr. Benson Norway, but was felt to have cardiac symptoms and was referred to me.   Patient has strong family h/o CAD, with his brother having had MI, requiring CABG in 71s. Patient himself underwent workup in [place], details below.   He was on Aspirin 100, candesartan 8, atorvastatin 40 in [place]. In the [place], he is now on Aspirin 81, losartan 25, atorvastatin 40.    Current Outpatient Medications:    aspirin 81 MG chewable tablet, Chew 1 tablet (81 mg total) by mouth daily., Disp: 90 tablet, Rfl: 3   atorvastatin (LIPITOR) 40 MG tablet, Take 1 tablet (40 mg total) by mouth daily., Disp: 90 tablet, Rfl: 3   ibuprofen (ADVIL,MOTRIN) 200 MG tablet, Take  200-400 mg by mouth every 6 (six) hours as needed. As needed for occasional headaches., Disp: , Rfl:    losartan (COZAAR) 25 MG tablet, Take 25 mg by mouth daily., Disp: , Rfl:    nitroGLYCERIN (NITROSTAT) 0.4 MG SL tablet, Place 1 tablet (0.4 mg total) under the tongue every 5 (five) minutes as needed for chest pain., Disp: 30 tablet, Rfl: 3  Cardiovascular and other pertinent studies:  EKG 12/11/2021: Sinus rhythm 67 bpm  RSR(V1) -nondiagnostic  Lexiscan (Walking with modified Bruce protocol) tetrofosmin stress test 04/14/2021: 1 Day Rest/Stress Protocol. Lexiscan (Walking with mod Bruce)Tetrofosmin Stress Test: Stress ECG equivocal for ischemia - upsloping ST depressions which resolved after 2 minutes into recovery. Normal myocardial perfusion without convincing evidence of reversible myocardial ischemia or prior infarct. Normal LV size and wall thickness. Calculated LVEF 61%.  Low risk study. No prior studies for comparison.    Coronary CT angiogram 11/2020 (performed in [place]): Calcium score: Total: 716, 75-90th percentile LM: 79 LAD: 473 Lcx: 162 RCA:  2  LM: No significant stenosis LAD: Proximal LAD calcific 67% stenosis, 12.5 mm from LAD origin Lcx: Proximal 87% calcific stenosis RCA: No significant disease  EKG 03/27/2021: Sinus rhythm 66 bpm  Incomplete RBBB  Recent labs: 01/21/2021: Glucose 88, BUN/Cr 13/0.88. EGFR 96. Na/K 140/4.3. Rest of the CMP normal H/H 15.2/46.7. MCV 91.9. Platelets 183 HbA1C  5.4% Chol 122, TG 75,  HDL 41, LDL 65   Review of Systems  Cardiovascular:  Positive for chest pain (Improved). Negative for dyspnea on exertion, leg swelling, palpitations and syncope.        Vitals:   12/11/21 1003  BP: (!) 145/72  Pulse: 71  Resp: 16  SpO2: 96%     Body mass index is 23.24 kg/m. Filed Weights   12/11/21 1003  Weight: 162 lb (73.5 kg)     Objective:   Physical Exam Vitals and nursing note reviewed.  Constitutional:       General: He is not in acute distress. Neck:     Vascular: No JVD.  Cardiovascular:     Rate and Rhythm: Normal rate and regular rhythm.     Heart sounds: Normal heart sounds. No murmur heard. Pulmonary:     Effort: Pulmonary effort is normal.     Breath sounds: Normal breath sounds. No wheezing or rales.  Musculoskeletal:     Right lower leg: No edema.     Left lower leg: No edema.         Assessment & Recommendations:   66 y.o. African American male with hypertension, hyperlipidemia, GERD, CAD  CAD: Obstructive CAD seen on CT coronary angiogram 11/2020 performed in [place], with moderate proximal LAD, and severe proximal left circumflex calcific stenosis. However, no ischemia on stress testing (04/2021) His 24X7 chest pain is not angina, it could be musculoskeletal.   Continue medical management with Aspirin 31m daily, atorvastatin 40 mg daily, metoprolol succinate 25 mg daily Caution against use of nitroglycerin and tadalafil concurrently.  Hypertension: Generally well controlled at home.  F/u in 6 months   MNigel Mormon MD Pager: [phone]: [phone]

## 2021-12-16 ENCOUNTER — Encounter (HOSPITAL_COMMUNITY): Payer: Self-pay

## 2021-12-16 ENCOUNTER — Ambulatory Visit (HOSPITAL_COMMUNITY)
Admission: EM | Admit: 2021-12-16 | Discharge: 2021-12-16 | Disposition: A | Discharge status: Home / Self Care | Payer: Medicaid Other | Attending: Emergency Medicine | Admitting: Emergency Medicine

## 2021-12-16 DIAGNOSIS — M79604 Pain in right leg: Secondary | ICD-10-CM | POA: Diagnosis not present

## 2021-12-16 DIAGNOSIS — M79605 Pain in left leg: Secondary | ICD-10-CM

## 2021-12-16 DIAGNOSIS — M542 Cervicalgia: Secondary | ICD-10-CM | POA: Diagnosis not present

## 2021-12-16 MED ORDER — BACLOFEN 10 MG PO TABS: 10.0000 mg | ORAL_TABLET | Freq: Every evening | ORAL | 0 refills | Status: DC | PRN

## 2021-12-16 MED ORDER — MELOXICAM 7.5 MG PO TABS: 7.5000 mg | ORAL_TABLET | Freq: Every day | ORAL | 0 refills | Status: DC

## 2021-12-16 MED ORDER — KETOROLAC TROMETHAMINE 30 MG/ML IJ SOLN
30.0000 mg | Freq: Once | INTRAMUSCULAR | Status: AC
  Administered 2021-12-16: 30 mg via INTRAMUSCULAR

## 2021-12-16 MED ORDER — KETOROLAC TROMETHAMINE 30 MG/ML IJ SOLN
INTRAMUSCULAR | Status: AC
  Filled 2021-12-16: qty 1

## 2021-12-16 NOTE — ED Triage Notes (Signed)
Pt c/o pain to both hips radiating down both legs x2 days. Denies injury, states stands up all day at work. Denies taking any meds for pai.n

## 2021-12-16 NOTE — Discharge Instructions (Signed)
Your pain is most likely caused by irritation to the muscles.  Starting tomorrow take meloxicam every morning with food for 5 days then you may use extra tablets as needed  You may use baclofen beginning this evening to help reduce pain while sleeping, be mindful this medication may  You may use heating pad in 15 minute intervals as needed for additional comfort, or you may find comfort in using ice in 10-15 minutes over affected area  Begin stretching affected area daily for 10 minutes as tolerated to further loosen muscles   When lying down place pillow underneath and between knees for support  Can try sleeping without pillow on firm mattress   Practice good posture: head back, shoulders back, chest forward, pelvis back and weight distributed evenly on both legs  If pain persist after recommended treatment or reoccurs if may be beneficial to follow up with orthopedic specialist for evaluation, this doctor specializes in the bones and can manage your symptoms long-term with options such as but not limited to imaging, medications or physical therapy

## 2021-12-16 NOTE — ED Provider Notes (Signed)
[facility]    CSN: [id number] Arrival date & time: 12/16/21  1641      History   Chief Complaint Chief Complaint  Patient presents with   Leg Pain    HPI Chris Young is a 66 y.o. male.   Patient presents with pain radiating from the bilateral hips down the anterior and posterior of the legs and left-sided neck pain for 2 days.  Denies injury, trauma or precipitating event, does endorse he works at [employer] 8 to 12-hour shifts which requires standing and lifting of objects.  Pain has begun to interfere with sleep and is worse when sitting, endorses that pain does improve some when he is moving around.  Able to bear weight bilaterally.  Bilaterally.  Has not attempted treatment of symptoms.  Denies numbness or tingling.   Past Medical History:  Diagnosis Date   Elevated blood pressure    Essential hypertension 03/27/2021   GERD (gastroesophageal reflux disease) 09/04/2014   History of cataract    Hyperlipidemia     Patient Active Problem List   Diagnosis Date Noted   Coronary artery disease of native artery of native heart with stable angina pectoris (HCC) 03/27/2021   Essential hypertension 03/27/2021   GERD (gastroesophageal reflux disease) 09/04/2014    Past Surgical History:  Procedure Laterality Date   EYE SURGERY     cataract       Home Medications    Prior to Admission medications   Medication Sig Start Date End Date Taking? Authorizing Provider  aspirin 81 MG chewable tablet Chew 1 tablet (81 mg total) by mouth daily. 12/11/21   Patwardhan, Anabel Bene, MD  atorvastatin (LIPITOR) 40 MG tablet Take 1 tablet (40 mg total) by mouth daily. 12/11/21   Patwardhan, Anabel Bene, MD  ibuprofen (ADVIL,MOTRIN) 200 MG tablet Take 200-400 mg by mouth every 6 (six) hours as needed. As needed for occasional headaches.    [provider]  losartan (COZAAR) 25 MG tablet Take 1 tablet (25 mg total) by mouth daily. 12/11/21   Patwardhan, Anabel Bene,  MD  metoprolol succinate (TOPROL XL) 25 MG 24 hr tablet Take 1 tablet (25 mg total) by mouth daily. Patient not taking: Reported on 12/11/2021 12/11/21   Elder Negus, MD  tadalafil (CIALIS) 5 MG tablet Take 5 mg by mouth daily as needed for erectile dysfunction.    [provider]    Family History Family History  Problem Relation Age of Onset   Stomach cancer Father    Heart disease Brother     Social History Social History   Tobacco Use   Smoking status: Never   Smokeless tobacco: Never  Vaping Use   Vaping Use: Never used  Substance Use Topics   Alcohol use: No    Alcohol/week: 0.0 standard drinks of alcohol   Drug use: No     Allergies   Patient has no known allergies.   Review of Systems Review of Systems  Constitutional: Negative.   Respiratory: Negative.    Cardiovascular: Negative.   Musculoskeletal:  Positive for myalgias and neck pain. Negative for arthralgias, back pain, gait problem, joint swelling and neck stiffness.  Skin: Negative.   Neurological: Negative.      Physical Exam Triage Vital Signs ED Triage Vitals [12/16/21 1703]  Enc Vitals Group     BP (!) 147/87     Pulse Rate 68     Resp 18     Temp 98.7 F (  37.1 C)     Temp Source Oral     SpO2 98 %     Weight      Height      Head Circumference      Peak Flow      Pain Score 5     Pain Loc      Pain Edu?      Excl. in [place]?    No data found.  Updated Vital Signs BP (!) 147/87 (BP Location: Left Arm)   Pulse 68   Temp 98.7 F (37.1 C) (Oral)   Resp 18   SpO2 98%   Visual Acuity Right Eye Distance:   Left Eye Distance:   Bilateral Distance:    Right Eye Near:   Left Eye Near:    Bilateral Near:     Physical Exam Constitutional:      Appearance: Normal appearance.  Eyes:     Extraocular Movements: Extraocular movements intact.  Pulmonary:     Effort: Pulmonary effort is normal.  Musculoskeletal:     Comments: Tenderness is felt along the anterior and  the posterior of the upper and lower legs without point tenderness, ecchymosis, swelling or deformity, able to bear weight, range of motion is intact, 2+ femoral and popliteal pulses, sensation is intact, strength is a 5 out of 5 bilaterally  Unable to reproduce tenderness to the neck, no ecchymosis, swelling or deformity, range of motion intact, 2+ carotid pulses, no rigidity or crepitus present  Neurological:     Mental Status: He is alert and oriented to person, place, and time. Mental status is at baseline.  Psychiatric:        Mood and Affect: Mood normal.        Behavior: Behavior normal.      UC Treatments / Results  Labs (all labs ordered are listed, but only abnormal results are displayed) Labs Reviewed - No data to display  EKG   Radiology No results found.  Procedures Procedures (including critical care time)  Medications Ordered in UC Medications - No data to display  Initial Impression / Assessment and Plan / UC Course  I have reviewed the triage vital signs and the nursing notes.  Pertinent labs & imaging results that were available during my care of the patient were reviewed by me and considered in my medical decision making (see chart for details).  Neck pain, bilateral leg pain  Etiology is most likely muscular, due to lack of injury will defer imaging, discussed with patient, Toradol injection given in office and prescribed meloxicam and baclofen for outpatient treatment, recommended RICE, heat, pillows for support, daily stretching and activity as tolerated for additional supportive measures, advised patient to follow-up with his primary doctor in 1 to 2 weeks for reevaluation Final Clinical Impressions(s) / UC Diagnoses   Final diagnoses:  None   Discharge Instructions   None    ED Prescriptions   None    PDMP not reviewed this encounter.   Valinda Hoar, NP 12/16/21 1724

## 2022-03-26 DIAGNOSIS — D72818 Other decreased white blood cell count: Secondary | ICD-10-CM | POA: Diagnosis not present

## 2022-03-26 DIAGNOSIS — E782 Mixed hyperlipidemia: Secondary | ICD-10-CM | POA: Diagnosis not present

## 2022-03-26 DIAGNOSIS — I2510 Atherosclerotic heart disease of native coronary artery without angina pectoris: Secondary | ICD-10-CM | POA: Diagnosis not present

## 2022-03-26 DIAGNOSIS — I10 Essential (primary) hypertension: Secondary | ICD-10-CM | POA: Diagnosis not present

## 2022-04-12 ENCOUNTER — Other Ambulatory Visit: Payer: Self-pay | Admitting: Urology

## 2022-04-12 DIAGNOSIS — N403 Nodular prostate with lower urinary tract symptoms: Secondary | ICD-10-CM

## 2022-04-12 DIAGNOSIS — Z125 Encounter for screening for malignant neoplasm of prostate: Secondary | ICD-10-CM

## 2022-05-07 ENCOUNTER — Ambulatory Visit
Admission: RE | Admit: 2022-05-07 | Discharge: 2022-05-07 | Disposition: A | Discharge status: Home / Self Care | Payer: 59 | Source: Ambulatory Visit | Attending: Urology | Admitting: Urology

## 2022-05-07 DIAGNOSIS — N403 Nodular prostate with lower urinary tract symptoms: Secondary | ICD-10-CM

## 2022-05-07 DIAGNOSIS — Z125 Encounter for screening for malignant neoplasm of prostate: Secondary | ICD-10-CM

## 2022-05-07 DIAGNOSIS — R972 Elevated prostate specific antigen [PSA]: Secondary | ICD-10-CM | POA: Diagnosis not present

## 2022-05-07 MED ORDER — GADOPICLENOL 0.5 MMOL/ML IV SOLN
7.0000 mL | Freq: Once | INTRAVENOUS | Status: AC | PRN
  Administered 2022-05-07: 7 mL via INTRAVENOUS

## 2022-06-18 ENCOUNTER — Encounter: Payer: BLUE CROSS/BLUE SHIELD | Admitting: Cardiology

## 2022-06-18 NOTE — Progress Notes (Deleted)
Patient referred by Norm Salt, PA for chest pain, CAD  Subjective:   Chris Young, male    DOB: 12-04-1955, 67 y.o.   MRN: [id number]   Chief Complaint  Patient presents with  . Error     HPI  67 y.o. African American male with hypertension, hyperlipidemia, GERD, CAD  Patient continues to have his longstanding chest pain, present since age 22, at all times.  This pain does not change with exertion.  He is able to continue his work at [employer], where he has to do a lot of physical activity.  He is taking all his medications, except metoprolol succinate.  He is not sure how he came off it.  On a separate note, he has tadalafill bottle with him, which he uses about 3 times a month.  Blood pressure elevated today, well controlled at home.  Initial consultation visit 03/2021: Patient was last seen in our office in 2016. He had negative exercise treadmill stress test then. He is now referred for chest pain after recent cardiac workup in [place]. 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 He reports retrosternal burning sensation on exertion on almost daily basis. He reports similar symptoms several years ago as well, but have clearly increased recently. He was seen by Dr. Elnoria Howard, but was felt to have cardiac symptoms and was referred to me.   Patient has strong family h/o CAD, with his brother having had MI, requiring CABG in 12s. Patient himself underwent workup in [place], details below.   He was on Aspirin 100, candesartan 8, atorvastatin 40 in [place]. In the [place], he is now on Aspirin 81, losartan 25, atorvastatin 40.    Current Outpatient Medications:  .  aspirin 81 MG chewable tablet, Chew 1 tablet (81 mg total) by mouth daily., Disp: 90 tablet, Rfl: 3 .  atorvastatin (LIPITOR) 40 MG tablet, Take 1 tablet (40 mg total) by mouth daily., Disp: 90 tablet, Rfl: 3 .  benzonatate (TESSALON) 100 MG capsule, Take 1 capsule (100 mg total) by mouth 3  (three) times daily as needed for cough. (Patient not taking: Reported on 11/20/2022), Disp: 21 capsule, Rfl: 0 .  losartan (COZAAR) 50 MG tablet, Take 1 tablet (50 mg total) by mouth daily., Disp: 90 tablet, Rfl: 3 .  meloxicam (MOBIC) 7.5 MG tablet, Take 1 tablet (7.5 mg total) by mouth daily as needed for pain., Disp: 7 tablet, Rfl: 0 .  metoprolol succinate (TOPROL XL) 25 MG 24 hr tablet, Take 1 tablet (25 mg total) by mouth daily. (Patient not taking: Reported on 09/21/2022), Disp: 90 tablet, Rfl: 3 .  tadalafil (CIALIS) 5 MG tablet, Take 5 mg by mouth daily as needed for erectile dysfunction., Disp: , Rfl:   Cardiovascular and other pertinent studies:  EKG 12/11/2021: Sinus rhythm 67 bpm  RSR(V1) -nondiagnostic  Lexiscan (Walking with modified Bruce protocol) tetrofosmin stress test 04/14/2021: 1 Day Rest/Stress Protocol. Lexiscan (Walking with mod Bruce)Tetrofosmin Stress Test: Stress ECG equivocal for ischemia - upsloping ST depressions which resolved after 2 minutes into recovery. Normal myocardial perfusion without convincing evidence of reversible myocardial ischemia or prior infarct. Normal LV size and wall thickness. Calculated LVEF 61%.  Low risk study. No prior studies for comparison.    Coronary CT angiogram 11/2020 (performed in [place]): Calcium score: Total: 716, 75-90th percentile LM: 79 LAD: 473 Lcx: 162 RCA:  2  LM: No significant stenosis LAD: Proximal LAD calcific 67% stenosis, 12.5 mm from  LAD origin Lcx: Proximal 87% calcific stenosis RCA: No significant disease  EKG 03/27/2021: Sinus rhythm 66 bpm  Incomplete RBBB  Recent labs: 01/21/2021: Glucose 88, BUN/Cr 13/0.88. EGFR 96. Na/K 140/4.3. Rest of the CMP normal H/H 15.2/46.7. MCV 91.9. Platelets 183 HbA1C  5.4% Chol 122, TG 75, HDL 41, LDL 65   Review of Systems  Cardiovascular:  Positive for chest pain (Improved). Negative for dyspnea on exertion, leg swelling, palpitations and syncope.          There were no vitals filed for this visit.    There is no height or weight on file to calculate BMI. There were no vitals filed for this visit.    Objective:   Physical Exam Vitals and nursing note reviewed.  Constitutional:      General: He is not in acute distress. Neck:     Vascular: No JVD.  Cardiovascular:     Rate and Rhythm: Normal rate and regular rhythm.     Heart sounds: Normal heart sounds. No murmur heard. Pulmonary:     Effort: Pulmonary effort is normal.     Breath sounds: Normal breath sounds. No wheezing or rales.  Musculoskeletal:     Right lower leg: No edema.     Left lower leg: No edema.          Assessment & Recommendations:   67 y.o. African American male with hypertension, hyperlipidemia, GERD, CAD  *** CAD: Obstructive CAD seen on CT coronary angiogram 11/2020 performed in [place], with moderate proximal LAD, and severe proximal left circumflex calcific stenosis. However, no ischemia on stress testing (04/2021) His 24X7 chest pain is not angina, it could be musculoskeletal.   Continue medical management with Aspirin 81mg  daily, atorvastatin 40 mg daily, metoprolol succinate 25 mg daily Caution against use of nitroglycerin and tadalafil concurrently.  Hypertension: Generally well controlled at home.  F/u in 6 months   Elder Negus, MD Pager: [phone] Office: [phone]  This encounter was created in error - please disregard.

## 2022-07-02 ENCOUNTER — Ambulatory Visit: Payer: BLUE CROSS/BLUE SHIELD | Admitting: Cardiology

## 2022-07-03 ENCOUNTER — Other Ambulatory Visit: Payer: Self-pay

## 2022-07-03 ENCOUNTER — Encounter (HOSPITAL_COMMUNITY): Payer: Self-pay | Admitting: *Deleted

## 2022-07-03 ENCOUNTER — Ambulatory Visit (HOSPITAL_COMMUNITY)
Admission: EM | Admit: 2022-07-03 | Discharge: 2022-07-03 | Disposition: A | Discharge status: Home / Self Care | Payer: BLUE CROSS/BLUE SHIELD | Attending: Family Medicine | Admitting: Family Medicine

## 2022-07-03 DIAGNOSIS — R1030 Lower abdominal pain, unspecified: Secondary | ICD-10-CM | POA: Insufficient documentation

## 2022-07-03 DIAGNOSIS — R300 Dysuria: Secondary | ICD-10-CM | POA: Insufficient documentation

## 2022-07-03 DIAGNOSIS — R3129 Other microscopic hematuria: Secondary | ICD-10-CM | POA: Insufficient documentation

## 2022-07-03 LAB — POCT URINALYSIS DIPSTICK, ED / UC
Bilirubin Urine: NEGATIVE
Glucose, UA: NEGATIVE mg/dL
Ketones, ur: NEGATIVE mg/dL
Leukocytes,Ua: NEGATIVE
Nitrite: NEGATIVE
Protein, ur: NEGATIVE mg/dL
Specific Gravity, Urine: 1.015 (ref 1.005–1.030)
Urobilinogen, UA: 0.2 mg/dL (ref 0.0–1.0)
pH: 5.5 (ref 5.0–8.0)

## 2022-07-03 LAB — CBG MONITORING, ED: Glucose-Capillary: 88 mg/dL (ref 70–99)

## 2022-07-03 MED ORDER — PHENAZOPYRIDINE HCL 200 MG PO TABS: 200.0000 mg | ORAL_TABLET | Freq: Two times a day (BID) | ORAL | 0 refills | Status: DC | PRN

## 2022-07-03 NOTE — ED Provider Notes (Signed)
[facility]    CSN: [id number] Arrival date & time: 07/03/22  1043      History   Chief Complaint Chief Complaint  Patient presents with   Abdominal Pain   Dysuria    HPI Chris Young is a 67 y.o. male.   Patient presents today with a prolonged history (many years) of lower abdominal pain and intermittent dysuria.  Patient speaks Arabic and video interpreter was utilized during visit.  He reports that this varies in frequency and intensity but is almost always present.  He has been seen in the emergency room and 2022 at which point CT scan of abdomen showed no acute abnormality.  He has since followed up with urology and had prostate MRI December 2023 that showed no significant abnormalities.  He reports that pain is much worse whenever he has a bladder full of urine.  He has no concern for STI and denies any penile discharge symptoms.  He denies history of diabetes.  He has never been told that he has interstitial cystitis.  Does report drinking a lot of caffeine as he drinks mostly tea throughout the day.  He has not had a cystoscopy but they have discussed scheduling this in the future but he had to postpone it as a result of upcoming Ramadan.  He does intend to call them to schedule an appointment beginning in April.  He denies any fever, nausea, vomiting.  He is requesting something to help manage his symptoms in the meantime.    Past Medical History:  Diagnosis Date   Elevated blood pressure    Essential hypertension 03/27/2021   GERD (gastroesophageal reflux disease) 09/04/2014   History of cataract    Hyperlipidemia     Patient Active Problem List   Diagnosis Date Noted   Coronary artery disease involving native coronary artery of native heart without angina pectoris 03/27/2021   Essential hypertension 03/27/2021   GERD (gastroesophageal reflux disease) 09/04/2014    Past Surgical History:  Procedure Laterality Date   EYE SURGERY     cataract        Home Medications    Prior to Admission medications   Medication Sig Start Date End Date Taking? Authorizing Provider  phenazopyridine (PYRIDIUM) 200 MG tablet Take 1 tablet (200 mg total) by mouth 2 (two) times daily as needed for pain. 07/03/22  Yes Lynnelle Mesmer, Derry Skill, PA-C  aspirin 81 MG chewable tablet Chew 1 tablet (81 mg total) by mouth daily. 12/11/21   Patwardhan, Reynold Bowen, MD  atorvastatin (LIPITOR) 40 MG tablet Take 1 tablet (40 mg total) by mouth daily. 12/11/21   Patwardhan, Reynold Bowen, MD  ibuprofen (ADVIL,MOTRIN) 200 MG tablet Take 200-400 mg by mouth every 6 (six) hours as needed. As needed for occasional headaches.    [provider]  losartan (COZAAR) 25 MG tablet Take 1 tablet (25 mg total) by mouth daily. 12/11/21   Patwardhan, Reynold Bowen, MD  meloxicam (MOBIC) 7.5 MG tablet Take 1 tablet (7.5 mg total) by mouth daily. 12/16/21   White, Leitha Schuller, NP  metoprolol succinate (TOPROL XL) 25 MG 24 hr tablet Take 1 tablet (25 mg total) by mouth daily. Patient not taking: Reported on 12/11/2021 12/11/21   Nigel Mormon, MD  tadalafil (CIALIS) 5 MG tablet Take 5 mg by mouth daily as needed for erectile dysfunction.    [provider]    Family History Family History  Problem Relation Age of Onset   Stomach cancer Father  Heart disease Brother     Social History Social History   Tobacco Use   Smoking status: Never   Smokeless tobacco: Never  Vaping Use   Vaping Use: Never used  Substance Use Topics   Alcohol use: No    Alcohol/week: 0.0 standard drinks of alcohol   Drug use: No     Allergies   Patient has no known allergies.   Review of Systems Review of Systems  Constitutional:  Positive for activity change. Negative for appetite change, fatigue and fever.  Respiratory:  Negative for cough and shortness of breath.   Gastrointestinal:  Positive for abdominal pain. Negative for diarrhea, nausea and vomiting.  Genitourinary:  Positive for  dysuria. Negative for frequency, penile discharge, penile pain and urgency.     Physical Exam Triage Vital Signs ED Triage Vitals  Enc Vitals Group     BP 07/03/22 1154 (!) 144/86     Pulse Rate 07/03/22 1154 64     Resp 07/03/22 1154 18     Temp 07/03/22 1154 98.7 F (37.1 C)     Temp src --      SpO2 07/03/22 1154 99 %     Weight --      Height --      Head Circumference --      Peak Flow --      Pain Score 07/03/22 1153 5     Pain Loc --      Pain Edu? --      Excl. in [place]? --    No data found.  Updated Vital Signs BP (!) 144/86   Pulse 64   Temp 98.7 F (37.1 C)   Resp 18   SpO2 99%   Visual Acuity Right Eye Distance:   Left Eye Distance:   Bilateral Distance:    Right Eye Near:   Left Eye Near:    Bilateral Near:     Physical Exam Vitals reviewed.  Constitutional:      General: He is awake.     Appearance: Normal appearance. He is well-developed. He is not ill-appearing.     Comments: Very pleasant male appears stated age in no acute distress sitting comfortably in exam room  HENT:     Head: Normocephalic and atraumatic.     Mouth/Throat:     Dentition: Abnormal dentition.     Pharynx: Uvula midline. No oropharyngeal exudate or posterior oropharyngeal erythema.  Cardiovascular:     Rate and Rhythm: Normal rate and regular rhythm.     Heart sounds: Normal heart sounds, S1 normal and S2 normal. No murmur heard. Pulmonary:     Effort: Pulmonary effort is normal.     Breath sounds: Normal breath sounds. No stridor. No wheezing, rhonchi or rales.     Comments: Clear to auscultation bilaterally Abdominal:     General: Bowel sounds are normal.     Palpations: Abdomen is soft.     Tenderness: There is no abdominal tenderness. There is no right CVA tenderness, left CVA tenderness, guarding or rebound.     Comments: Benign abdominal exam  Neurological:     Mental Status: He is alert.  Psychiatric:        Behavior: Behavior is cooperative.      UC  Treatments / Results  Labs (all labs ordered are listed, but only abnormal results are displayed) Labs Reviewed  POCT URINALYSIS DIPSTICK, ED / UC - Abnormal; Notable for the following components:      Result  Value   Hgb urine dipstick TRACE (*)    All other components within normal limits  URINE CULTURE  CBG MONITORING, ED    EKG   Radiology No results found.  Procedures Procedures (including critical care time)  Medications Ordered in UC Medications - No data to display  Initial Impression / Assessment and Plan / UC Course  I have reviewed the triage vital signs and the nursing notes.  Pertinent labs & imaging results that were available during my care of the patient were reviewed by me and considered in my medical decision making (see chart for details).     Patient is well-appearing, afebrile, nontoxic, nontachycardic.  Vital signs and physical exam are reassuring today with no indication for emergent evaluation or imaging.  Blood sugar was appropriate.  UA showed hemoglobin but was otherwise normal.  Discussed that there are is no evidence of infection so would defer antibiotics.  Will send urine culture given hematuria on UA as well as significant dysuria symptoms but defer antibiotics until results are available.  We discussed that given blood noted on UA he does need to follow-up with urology to have cystoscopy performed and encouraged him to call and schedule an appointment for the procedure after Ramadan.  Recommended that he use Pyridium to help manage symptoms but discussed that this only treats his symptoms and does not address the underlying issue so follow-up remains important.  If he develops any worsening or changing symptoms he needs to be seen immediately including increasing pain, hematuria, nausea/vomiting, fever.  Strict return precautions given to which he expressed understanding.  Patient is being billed level for due to time spent; 35 minutes spent evaluating  patient with translator and discussing previous imaging findings or developing treatment plan.  Final Clinical Impressions(s) / UC Diagnoses   Final diagnoses:  Lower abdominal pain  Dysuria  Microscopic hematuria     Discharge Instructions      ??? ???? ????? ??? ?????? ?? ????. ?? ????? ???????? ?? ??? ??????? ??????? ?? ????? ?????? ????? ???????. ????? ??????? ?????????? ???????? ?? ????? ?????. ????? ??? ??? ????? ??? ?????. ????? ?????? ???? ???????? ???? ?????? ?????? ??????? ??? ?????. ???? ?????? ???? ??????? ???? ??? ???? ?? ???? ??? ??????? ??????? ???? ???? ??. ??? ??? ???? ?? ????? ??????? ??? ?? ??? ????? ?????? ?????? ??????? ????????? ??????? ??????? ???? ???? ??? ?????. kan bwlak yahtawi ealaa alqalil min aldum. min almuhimi almutabaeat mae qism almasalik albuliat fi 'abril li'iijra' tanzir almathanati. [place] aistikhdam albiridyum lilmusaeadat fi takhfif al'almi. sayuadiy hadha 'iilaa taghyir lawn [place]. 'ansahuk bitaqlil kamiyat alkafyin alati tashrabuha waziadat alsawayil mithl alma'i. naqum bi'iirsal biwlik lilthaqafat wa'iidha kan hunak 'ayu dalil ealaa al'iisabat bialeadwaa fasawf natasil bika. 'iidha kan ladayk 'ayu 'aerad mutafaqimat bima fi dhalik ziadat al'almi, wasueubat altabula, walghathayani, walqay', walhumaa, fayajib fahsuk ealaa alfur.    ED Prescriptions     Medication Sig Dispense Auth. Provider   phenazopyridine (PYRIDIUM) 200 MG tablet Take 1 tablet (200 mg total) by mouth 2 (two) times daily as needed for pain. 6 tablet Alvan Culpepper, Derry Skill, PA-C      PDMP not reviewed this encounter.   Terrilee Croak, PA-C 07/03/22 1250

## 2022-07-03 NOTE — Discharge Instructions (Addendum)
??? ???? ????? ??? ?????? ?? ????. ?? ????? ???????? ?? ??? ??????? ??????? ?? ????? ?????? ????? ???????. ????? ??????? ?????????? ???????? ?? ????? ?????. ????? ??? ??? ????? ??? ?????. ????? ?????? ???? ???????? ???? ?????? ?????? ??????? ??? ?????. ???? ?????? ???? ??????? ???? ??? ???? ?? ???? ??? ??????? ??????? ???? ???? ??. ??? ??? ???? ?? ????? ??????? ??? ?? ??? ????? ?????? ?????? ??????? ????????? ??????? ??????? ???? ???? ??? ?????.   kan bwlak yahtawi ealaa alqalil min aldum. min almuhimi almutabaeat mae qism almasalik albuliat fi 'abril li'iijra' tanzir almathanati. [place] aistikhdam albiridyum lilmusaeadat fi takhfif al'almi. sayuadiy hadha 'iilaa taghyir lawn [place]. '[place] bitaqlil kamiyat alkafyin alati tashrabuha waziadat alsawayil mithl alma'i. naqum bi'iirsal biwlik lilthaqafat wa'iidha kan hunak 'ayu dalil ealaa al'iisabat bialeadwaa fasawf natasil bika. 'iidha kan ladayk 'ayu 'aerad mutafaqimat bima fi dhalik ziadat al'almi, wasueubat altabula, walghathayani, walqay', walhumaa, fayajib fahsuk ealaa alfur.

## 2022-07-03 NOTE — ED Triage Notes (Signed)
Pt reports ongoing lower abd pain and dysuria. Pt has sen a MD and had a MRI but wants to know why he still has pain.

## 2022-07-04 LAB — URINE CULTURE: Culture: NO GROWTH

## 2022-07-12 ENCOUNTER — Ambulatory Visit (HOSPITAL_COMMUNITY)
Admission: EM | Admit: 2022-07-12 | Discharge: 2022-07-12 | Disposition: A | Discharge status: Home / Self Care | Payer: BLUE CROSS/BLUE SHIELD | Attending: Family Medicine | Admitting: Family Medicine

## 2022-07-12 ENCOUNTER — Encounter (HOSPITAL_COMMUNITY): Payer: Self-pay

## 2022-07-12 DIAGNOSIS — M542 Cervicalgia: Secondary | ICD-10-CM | POA: Diagnosis not present

## 2022-07-12 MED ORDER — BENZONATATE 100 MG PO CAPS: 100.0000 mg | ORAL_CAPSULE | Freq: Three times a day (TID) | ORAL | 0 refills | Status: AC | PRN

## 2022-07-12 MED ORDER — MELOXICAM 7.5 MG PO TABS: 7.5000 mg | ORAL_TABLET | Freq: Every day | ORAL | 0 refills | Status: AC | PRN

## 2022-07-12 NOTE — ED Triage Notes (Signed)
Patient reports that he is having discomfort on the right side of the anterior neck. Patient states he has discomfort with palpitation, turning his neck, yawning, and when he prays since yesterday.  Patient states he has not had any medication for pain.  Patient denies any problems swallowing

## 2022-07-12 NOTE — Discharge Instructions (Signed)
Meloxicam 7.5 mg--1 tablet daily as needed for pain  Take benzonatate 100 mg, 1 tab every 8 hours as needed for cough.  Make a follow-up appointment with your primary care to follow-up on this issue

## 2022-07-12 NOTE — ED Provider Notes (Addendum)
[place]    CSN: [id number] Arrival date & time: 07/12/22  1555      History   Chief Complaint Chief Complaint  Patient presents with   Neck Pain    HPI Chris Young is a 67 y.o. male.    Neck Pain  Here for anterior bilateral neck pain.  He states has been going on for more than a month.  It localizes more last night to the right side.  He states that it is worse on palpation, or if he turns his head it is worse.  Also when he bends his head down it is worse.  Swallowing seems to cause it to be worse, but then he states it is not inside his throat.  He did have a little cough at night last night, but he states does not bother him all day today.  No fever or chills.  Past Medical History:  Diagnosis Date   Elevated blood pressure    Essential hypertension 03/27/2021   GERD (gastroesophageal reflux disease) 09/04/2014   History of cataract    Hyperlipidemia     Patient Active Problem List   Diagnosis Date Noted   Coronary artery disease involving native coronary artery of native heart without angina pectoris 03/27/2021   Essential hypertension 03/27/2021   GERD (gastroesophageal reflux disease) 09/04/2014    Past Surgical History:  Procedure Laterality Date   EYE SURGERY     cataract       Home Medications    Prior to Admission medications   Medication Sig Start Date End Date Taking? Authorizing Provider  benzonatate (TESSALON) 100 MG capsule Take 1 capsule (100 mg total) by mouth 3 (three) times daily as needed for cough. 07/12/22  Yes Barrett Henle, MD  meloxicam (MOBIC) 7.5 MG tablet Take 1 tablet (7.5 mg total) by mouth daily as needed for pain. 07/12/22  Yes Barrett Henle, MD  aspirin 81 MG chewable tablet Chew 1 tablet (81 mg total) by mouth daily. 12/11/21   Patwardhan, Reynold Bowen, MD  atorvastatin (LIPITOR) 40 MG tablet Take 1 tablet (40 mg total) by mouth daily. 12/11/21   Patwardhan, Reynold Bowen, MD  losartan (COZAAR) 25 MG  tablet Take 1 tablet (25 mg total) by mouth daily. 12/11/21   Patwardhan, Reynold Bowen, MD  metoprolol succinate (TOPROL XL) 25 MG 24 hr tablet Take 1 tablet (25 mg total) by mouth daily. Patient not taking: Reported on 12/11/2021 12/11/21   Nigel Mormon, MD  tadalafil (CIALIS) 5 MG tablet Take 5 mg by mouth daily as needed for erectile dysfunction.    [provider]    Family History Family History  Problem Relation Age of Onset   Stomach cancer Father    Heart disease Brother     Social History Social History   Tobacco Use   Smoking status: Never   Smokeless tobacco: Never  Vaping Use   Vaping Use: Never used  Substance Use Topics   Alcohol use: No    Alcohol/week: 0.0 standard drinks of alcohol   Drug use: No     Allergies   Patient has no known allergies.   Review of Systems Review of Systems  Musculoskeletal:  Positive for neck pain.     Physical Exam Triage Vital Signs ED Triage Vitals  Enc Vitals Group     BP 07/12/22 1718 138/77     Pulse Rate 07/12/22 1718 65     Resp 07/12/22 1718 16  Temp 07/12/22 1718 98.9 F (37.2 C)     Temp Source 07/12/22 1718 Oral     SpO2 07/12/22 1718 97 %     Weight --      Height --      Head Circumference --      Peak Flow --      Pain Score 07/12/22 1720 6     Pain Loc --      Pain Edu? --      Excl. in [place]? --    No data found.  Updated Vital Signs BP 138/77 (BP Location: Left Arm)   Pulse 65   Temp 98.9 F (37.2 C) (Oral)   Resp 16   SpO2 97%   Visual Acuity Right Eye Distance:   Left Eye Distance:   Bilateral Distance:    Right Eye Near:   Left Eye Near:    Bilateral Near:     Physical Exam Vitals reviewed.  Constitutional:      General: He is not in acute distress.    Appearance: He is not ill-appearing, toxic-appearing or diaphoretic.  HENT:     Nose: Nose normal.     Mouth/Throat:     Mouth: Mucous membranes are moist.     Pharynx: No oropharyngeal exudate or posterior  oropharyngeal erythema.  Eyes:     Extraocular Movements: Extraocular movements intact.     Conjunctiva/sclera: Conjunctivae normal.     Pupils: Pupils are equal, round, and reactive to light.  Neck:     Vascular: No carotid bruit.  Cardiovascular:     Rate and Rhythm: Normal rate and regular rhythm.     Heart sounds: No murmur heard. Pulmonary:     Effort: Pulmonary effort is normal. No respiratory distress.     Breath sounds: Normal breath sounds. No stridor. No wheezing, rhonchi or rales.  Musculoskeletal:     Cervical back: Neck supple. Tenderness (bilateral in submandibular area) present.  Lymphadenopathy:     Cervical: No cervical adenopathy.  Skin:    Capillary Refill: Capillary refill takes less than 2 seconds.     Coloration: Skin is not pale.  Neurological:     General: No focal deficit present.     Mental Status: He is alert and oriented to person, place, and time.  Psychiatric:        Behavior: Behavior normal.      UC Treatments / Results  Labs (all labs ordered are listed, but only abnormal results are displayed) Labs Reviewed - No data to display  EKG   Radiology No results found.  Procedures Procedures (including critical care time)  Medications Ordered in UC Medications - No data to display  Initial Impression / Assessment and Plan / UC Course  I have reviewed the triage vital signs and the nursing notes.  Pertinent labs & imaging results that were available during my care of the patient were reviewed by me and considered in my medical decision making (see chart for details).        I will treat with anti-inflammatory medication for a few days.  Of asked him to follow-up with primary care if not improving.  Also Tessalon Perles are sent in for the cough that he has had some at night Final Clinical Impressions(s) / UC Diagnoses   Final diagnoses:  Neck pain     Discharge Instructions      Meloxicam 7.5 mg--1 tablet daily as needed for  pain  Take benzonatate 100 mg, 1 tab  every 8 hours as needed for cough.  Make a follow-up appointment with your primary care to follow-up on this issue     ED Prescriptions     Medication Sig Dispense Auth. Provider   benzonatate (TESSALON) 100 MG capsule Take 1 capsule (100 mg total) by mouth 3 (three) times daily as needed for cough. 21 capsule Barrett Henle, MD   meloxicam (MOBIC) 7.5 MG tablet Take 1 tablet (7.5 mg total) by mouth daily as needed for pain. 7 tablet Fitzhugh Vizcarrondo, Gwenlyn Perking, MD      PDMP not reviewed this encounter.   Barrett Henle, MD 07/12/22 Mirian Capuchin    Barrett Henle, MD 07/12/22 [phone]

## 2022-09-21 ENCOUNTER — Encounter: Payer: Self-pay | Admitting: Interventional Cardiology

## 2022-09-21 ENCOUNTER — Ambulatory Visit: Payer: Medicare Other | Attending: Interventional Cardiology | Admitting: Interventional Cardiology

## 2022-09-21 VITALS — BP 154/84 | HR 70 | Ht 69.0 in | Wt 156.2 lb

## 2022-09-21 DIAGNOSIS — I25118 Atherosclerotic heart disease of native coronary artery with other forms of angina pectoris: Secondary | ICD-10-CM

## 2022-09-21 DIAGNOSIS — I10 Essential (primary) hypertension: Secondary | ICD-10-CM | POA: Diagnosis present

## 2022-09-21 DIAGNOSIS — Z8249 Family history of ischemic heart disease and other diseases of the circulatory system: Secondary | ICD-10-CM

## 2022-09-21 DIAGNOSIS — E782 Mixed hyperlipidemia: Secondary | ICD-10-CM | POA: Diagnosis present

## 2022-09-21 MED ORDER — LOSARTAN POTASSIUM 50 MG PO TABS: 50.0000 mg | ORAL_TABLET | Freq: Every day | ORAL | 3 refills | Status: DC

## 2022-09-21 NOTE — Progress Notes (Signed)
Cardiology Office Note   Date:  09/21/2022   ID:  Esmond Plants, DOB [birth year], MRN [id number]  PCP:  Norm Salt, PA    No chief complaint on file.  CAD  Wt Readings from Last 3 Encounters:  09/21/22 156 lb 3.2 oz (70.9 kg)  12/11/21 162 lb (73.5 kg)  05/22/21 161 lb (73 kg)       History of Present Illness: Chris Young is a 67 y.o. male  with moderate CAD.  Seen by Dr. Joaquim Nam in the past.   Prior cardiac testing has been done. Lexiscan (Walking with modified Bruce protocol) tetrofosmin stress test 04/14/2021: 1 Day Rest/Stress Protocol. Lexiscan (Walking with mod Bruce)Tetrofosmin Stress Test: Stress ECG equivocal for ischemia - upsloping ST depressions which resolved after 2 minutes into recovery. Normal myocardial perfusion without convincing evidence of reversible myocardial ischemia or prior infarct. Normal LV size and wall thickness. Calculated LVEF 61%.  Low risk study. No prior studies for comparison.  Coronary CT angiogram 11/2020 (performed in [place]): Calcium score: Total: 716, 75-90th percentile LM: 79 LAD: 473 Lcx: 162 RCA:  2  LM: No significant stenosis LAD: Proximal LAD calcific 67% stenosis, 12.5 mm from LAD origin Lcx: Proximal 87% calcific stenosis RCA: No significant disease  Due to negative stress test, cath was not performed.  States chest pain has been present since age 53, daily.  No change with exertion.  Sometimes, it improves with exertion.  Past Medical History:  Diagnosis Date   Coronary artery disease    Elevated blood pressure    Essential hypertension 03/27/2021   GERD (gastroesophageal reflux disease) 09/04/2014   History of cataract    Hyperlipidemia    Neck pain     Past Surgical History:  Procedure Laterality Date   EYE SURGERY     cataract     Current Outpatient Medications  Medication Sig Dispense Refill   aspirin 81 MG chewable tablet Chew 1 tablet (81 mg total) by mouth  daily. 90 tablet 3   atorvastatin (LIPITOR) 40 MG tablet Take 1 tablet (40 mg total) by mouth daily. 90 tablet 3   benzonatate (TESSALON) 100 MG capsule Take 1 capsule (100 mg total) by mouth 3 (three) times daily as needed for cough. 21 capsule 0   losartan (COZAAR) 25 MG tablet Take 1 tablet (25 mg total) by mouth daily. 90 tablet 3   meloxicam (MOBIC) 7.5 MG tablet Take 1 tablet (7.5 mg total) by mouth daily as needed for pain. 7 tablet 0   tadalafil (CIALIS) 5 MG tablet Take 5 mg by mouth daily as needed for erectile dysfunction.     metoprolol succinate (TOPROL XL) 25 MG 24 hr tablet Take 1 tablet (25 mg total) by mouth daily. (Patient not taking: Reported on 09/21/2022) 90 tablet 3   No current facility-administered medications for this visit.    Allergies:   Patient has no known allergies.    Social History:  The patient  reports that he has never smoked. He has never used smokeless tobacco. He reports that he does not drink alcohol and does not use drugs.   Family History:  The patient's family history includes Heart disease in his brother; Stomach cancer in his father.    ROS:  Please see the history of present illness.   Otherwise, review of systems are positive for chronic chest pain for 50 years.   All other systems are reviewed and negative.    PHYSICAL EXAM: VS:  BP (!) 154/84   Pulse 70   Ht 5\' 9"  (1.753 m)   Wt 156 lb 3.2 oz (70.9 kg)   SpO2 98%   BMI 23.07 kg/m  , BMI Body mass index is 23.07 kg/m. GEN: Well nourished, well developed, in no acute distress HEENT: normal Neck: no JVD, carotid bruits, or masses Cardiac: RRR; no murmurs, rubs, or gallops,no edema  Respiratory:  clear to auscultation bilaterally, normal work of breathing GI: soft, nontender, nondistended, + BS MS: no deformity or atrophy Skin: warm and dry, no rash Neuro:  Strength and sensation are intact Psych: euthymic mood, full affect   EKG:   The ekg ordered today demonstrates normal ECG,  no ST changes   Recent Labs: No results found for requested labs within last 365 days.   Lipid Panel No results found for: "CHOL", "TRIG", "HDL", "CHOLHDL", "VLDL", "LDLCALC", "LDLDIRECT"   Other studies Reviewed: Additional studies/ records that were reviewed today with results demonstrating: prior stress test reviewed.   ASSESSMENT AND PLAN:  CAD: Moderate by prior CTA done in [place].  Negative stress test.  Changing cardiologist due to insurance coverage issues.  No anginal pain.  Continue aggressive secondary prevention.   Hypertension: Avoid excessive salt.  Exercise target as noted below.  BP at home in the  high 130-140s range.  Increase losartan to 50 mg daily.  BMet in a week.  If BP gets too low at home, will go back to the 25 mg daily dose. Hyperlipidemia: Whole food, plant-based diet.  High-fiber diet.  Avoid processed foods.  LDL 70 in 2022. Recheck liver and lipids with his blood test in a few weeks.  Family h/o CAD: Several family members overseas have heart disease.     Current medicines are reviewed at length with the patient today.  The patient concerns regarding his medicines were addressed.  The following changes have been made:  No change  Labs/ tests ordered today include:  No orders of the defined types were placed in this encounter.   Recommend 150 minutes/week of aerobic exercise Low fat, low carb, high fiber diet recommended  Disposition:   FU in 1 year   Signed, Lance Muss, MD  09/21/2022 10:16 AM    [facility] [address] [place], [place], [place]  [address] Phone: [phone]; Fax: [phone]

## 2022-09-21 NOTE — Patient Instructions (Signed)
Medication Instructions:  Your physician has recommended you make the following change in your medication: Increase Losartan to 50 mg by mouth daily  *If you need a refill on your cardiac medications before your next appointment, please call your pharmacy*   Lab Work: Your physician recommends that you return for lab work on Oct 01, 2022.  You can stop by the office anytime from 7:15 AM to 4:30 PM for lab work  If you have labs (blood work) drawn today and your tests are completely normal, you will receive your results only by: MyChart Message (if you have MyChart) OR A paper copy in the mail If you have any lab test that is abnormal or we need to change your treatment, we will call you to review the results.   Testing/Procedures: none   Follow-Up: At [facility], you and your health needs are our priority.  As part of our continuing mission to provide you with exceptional heart care, we have created designated Provider Care Teams.  These Care Teams include your primary Cardiologist (physician) and Advanced Practice Providers (APPs -  Physician Assistants and Nurse Practitioners) who all work together to provide you with the care you need, when you need it.  We recommend signing up for the patient portal called "MyChart".  Sign up information is provided on this After Visit Summary.  MyChart is used to connect with patients for Virtual Visits (Telemedicine).  Patients are able to view lab/test results, encounter notes, upcoming appointments, etc.  Non-urgent messages can be sent to your provider as well.   To learn more about what you can do with MyChart, go to [email or web address].    Your next appointment:   12 month(s)  Provider:   Lance Muss, MD     Other Instructions

## 2022-10-01 ENCOUNTER — Ambulatory Visit: Payer: Medicare Other | Attending: Interventional Cardiology

## 2022-10-01 DIAGNOSIS — Z8249 Family history of ischemic heart disease and other diseases of the circulatory system: Secondary | ICD-10-CM

## 2022-10-01 DIAGNOSIS — E782 Mixed hyperlipidemia: Secondary | ICD-10-CM

## 2022-10-01 DIAGNOSIS — I10 Essential (primary) hypertension: Secondary | ICD-10-CM

## 2022-10-01 DIAGNOSIS — I25118 Atherosclerotic heart disease of native coronary artery with other forms of angina pectoris: Secondary | ICD-10-CM

## 2022-10-02 LAB — COMPREHENSIVE METABOLIC PANEL
ALT: 27 IU/L (ref 0–44)
AST: 18 IU/L (ref 0–40)
Albumin/Globulin Ratio: 2 (ref 1.2–2.2)
Albumin: 4.4 g/dL (ref 3.9–4.9)
Alkaline Phosphatase: 82 IU/L (ref 44–121)
BUN/Creatinine Ratio: 20 (ref 10–24)
BUN: 18 mg/dL (ref 8–27)
Bilirubin Total: 0.7 mg/dL (ref 0.0–1.2)
CO2: 25 mmol/L (ref 20–29)
Calcium: 9.5 mg/dL (ref 8.6–10.2)
Chloride: 104 mmol/L (ref 96–106)
Creatinine, Ser: 0.9 mg/dL (ref 0.76–1.27)
Globulin, Total: 2.2 g/dL (ref 1.5–4.5)
Glucose: 89 mg/dL (ref 70–99)
Potassium: 4 mmol/L (ref 3.5–5.2)
Sodium: 141 mmol/L (ref 134–144)
Total Protein: 6.6 g/dL (ref 6.0–8.5)
eGFR: 94 mL/min/{1.73_m2} (ref >59)

## 2022-10-02 LAB — LIPID PANEL
Chol/HDL Ratio: 2.5 ratio (ref 0.0–5.0)
Cholesterol, Total: 113 mg/dL (ref 100–199)
HDL: 46 mg/dL (ref >39)
LDL Chol Calc (NIH): 53 mg/dL (ref 0–99)
Triglycerides: 64 mg/dL (ref 0–149)
VLDL Cholesterol Cal: 14 mg/dL (ref 5–40)

## 2022-10-11 ENCOUNTER — Telehealth: Payer: Self-pay | Admitting: Interventional Cardiology

## 2022-10-11 NOTE — Telephone Encounter (Signed)
-----   Message from Corky Crafts, MD sent at 10/02/2022  8:45 PM EDT ----- All normal labs. CONtinue current meds.

## 2022-10-11 NOTE — Telephone Encounter (Signed)
Patient is returning call to discuss lab results. 

## 2022-10-11 NOTE — Telephone Encounter (Signed)
Patient notified by interpreter [phone]).  Patient reports he had some lightheadedness initially after losartan increased.  Since change was made he has checked BP one time and it was 137/70.  No lightheadedness for the last 2 days.   I asked him to check BP daily for the next 10 days and send readings in through my chart, call to office or drop readings off in office.  Patient also notified to contact office if lightheadedness returns.

## 2022-10-26 ENCOUNTER — Telehealth: Payer: Self-pay | Admitting: Interventional Cardiology

## 2022-10-26 NOTE — Telephone Encounter (Signed)
Spoke with patient through interpreter [phone]).  Patient reports BP has been running 118/72,122/77 and 128/78.  He is no longer having lightheadedness.  Patient advised to continue current medications.

## 2022-10-26 NOTE — Telephone Encounter (Signed)
Patient would like a call back from provider specifically if he could.

## 2022-10-26 NOTE — Telephone Encounter (Signed)
Patient want to know if someone could call him about his Lab results. Patient also want to know if he suppose to keep taking atorvastatin for a short period of time or is this something he suppose to take for a long time.

## 2022-10-26 NOTE — Telephone Encounter (Signed)
Patient notified by interpreter [phone]) that lab results were normal and he should continue current medications. Patient made aware that Atorvastatin is a long term medication

## 2022-11-20 ENCOUNTER — Encounter (HOSPITAL_COMMUNITY): Payer: Self-pay | Admitting: Emergency Medicine

## 2022-11-20 ENCOUNTER — Ambulatory Visit (HOSPITAL_COMMUNITY)
Admission: EM | Admit: 2022-11-20 | Discharge: 2022-11-20 | Disposition: A | Discharge status: Home / Self Care | Payer: Medicare Other | Attending: Family Medicine | Admitting: Family Medicine

## 2022-11-20 ENCOUNTER — Other Ambulatory Visit: Payer: Self-pay

## 2022-11-20 DIAGNOSIS — M79605 Pain in left leg: Secondary | ICD-10-CM | POA: Diagnosis not present

## 2022-11-20 DIAGNOSIS — M79604 Pain in right leg: Secondary | ICD-10-CM | POA: Diagnosis not present

## 2022-11-20 MED ORDER — KETOROLAC TROMETHAMINE 30 MG/ML IJ SOLN
INTRAMUSCULAR | Status: AC
  Filled 2022-11-20: qty 1

## 2022-11-20 MED ORDER — DEXAMETHASONE SODIUM PHOSPHATE 10 MG/ML IJ SOLN
10.0000 mg | Freq: Once | INTRAMUSCULAR | Status: AC
  Administered 2022-11-20: 10 mg via INTRAMUSCULAR

## 2022-11-20 MED ORDER — DEXAMETHASONE SODIUM PHOSPHATE 10 MG/ML IJ SOLN
INTRAMUSCULAR | Status: AC
  Filled 2022-11-20: qty 1

## 2022-11-20 MED ORDER — KETOROLAC TROMETHAMINE 30 MG/ML IJ SOLN
15.0000 mg | Freq: Once | INTRAMUSCULAR | Status: AC
  Administered 2022-11-20: 15 mg via INTRAMUSCULAR

## 2022-11-20 NOTE — ED Triage Notes (Signed)
Pain in both thighs.  Unable to sleep.  Denies lower back pain.  Denies injury.  When patient sat down, grimaced with pain in both thighs  Patient is not taking any medicine

## 2022-11-20 NOTE — Discharge Instructions (Signed)
Meds ordered this encounter  Medications   dexamethasone (DECADRON) injection 10 mg   ketorolac (TORADOL) 30 MG/ML injection 15 mg

## 2022-11-24 NOTE — ED Provider Notes (Signed)
[facility]   [id number] 11/20/22 Arrival Time: 1700  ASSESSMENT & PLAN:  1. Bilateral leg pain    No indications for plain imaging. Ques muscular +/- sciatic-like pain; discussed.  Trial of: Meds ordered this encounter  Medications   dexamethasone (DECADRON) injection 10 mg   ketorolac (TORADOL) 30 MG/ML injection 15 mg   Activities as tolerated. Without LE neurological exam changes.  Work/school excuse note: provided. Recommend:  Follow-up Information     Norm Salt, [place].   Specialty: Physician Assistant Why: As needed. Contact information: [address] [place] [address] [phone]         [facility].   Specialty: Urgent Care Why: If worsening or failing to improve as anticipated. Contact information: [address] [place] [address] [phone]                Reviewed expectations re: course of current medical issues. Questions answered. Outlined signs and symptoms indicating need for more acute intervention. Patient verbalized understanding. After Visit Summary given.  SUBJECTIVE: History from: patient. Chris Young is a 67 y.o. male who reports  Pain in both thighs; noted a few days ago; "feel it down the back of my legs". Denies lower back pain. Denies injury/trauma. Is ambulatory here. No h/o similar. Denies change in bowel/bladder habits.   Past Surgical History:  Procedure Laterality Date   EYE SURGERY     cataract      OBJECTIVE:  Vitals:   11/20/22 1748  BP: (!) 144/83  Pulse: 70  Resp: 18  Temp: 98.6 F (37 C)  TempSrc: Oral  SpO2: 96%    General appearance: alert; no distress HEENT: [place]; AT Neck: supple with FROM Resp: unlabored respirations Extremities: BLE:  with FROM; normal strength; does reports some tenderness over both post thighs; no skin changes CV: brisk extremity capillary refill of bilateral LE; 2+ DP pulse of  bilateral LE. Skin: warm and dry; no visible rashes Neurologic: gait normal; normal sensation and strength of bilateral LE Psychological: alert and cooperative; normal mood and affect   No Known Allergies  Past Medical History:  Diagnosis Date   Coronary artery disease    Elevated blood pressure    Essential hypertension 03/27/2021   GERD (gastroesophageal reflux disease) 09/04/2014   History of cataract    Hyperlipidemia    Neck pain    Social History   Socioeconomic History   Marital status: Married    Spouse name: Not on file   Number of children: Not on file   Years of education: Not on file   Highest education level: Not on file  Occupational History   Not on file  Tobacco Use   Smoking status: Never   Smokeless tobacco: Never  Vaping Use   Vaping status: Never Used  Substance and Sexual Activity   Alcohol use: No    Alcohol/week: 0.0 standard drinks of alcohol   Drug use: No   Sexual activity: Not on file  Other Topics Concern   Not on file  Social History Narrative   ** Merged History Encounter **       ** Data from: 07/18/14 Enc Dept: [facility]       ** Data from: 09/04/14 Enc Dept: [facility]   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

## 2023-01-13 NOTE — Progress Notes (Signed)
This encounter was created in error - please disregard.

## 2023-04-27 ENCOUNTER — Telehealth: Payer: Self-pay | Admitting: Interventional Cardiology

## 2023-04-27 MED ORDER — ASPIRIN 81 MG PO CHEW: 81.0000 mg | CHEWABLE_TABLET | Freq: Every day | ORAL | 1 refills | Status: AC

## 2023-04-27 MED ORDER — ATORVASTATIN CALCIUM 40 MG PO TABS: 40.0000 mg | ORAL_TABLET | Freq: Every day | ORAL | 1 refills | Status: AC

## 2023-04-27 NOTE — Telephone Encounter (Signed)
*  STAT* If patient is at the pharmacy, call can be transferred to refill team.   1. Which medications need to be refilled? (please list name of each medication and dose if known) aspirin 81 MG chewable tablet   atorvastatin (LIPITOR) 40 MG tablet   2. Which pharmacy/location (including street and city if local pharmacy) is medication to be sent to? [id number] - [place], [place] - [address]   3. Do they need a 30 day or 90 day supply? 90

## 2023-05-27 ENCOUNTER — Other Ambulatory Visit: Payer: Self-pay

## 2023-05-27 ENCOUNTER — Encounter (HOSPITAL_COMMUNITY): Payer: Self-pay | Admitting: *Deleted

## 2023-05-27 ENCOUNTER — Ambulatory Visit (HOSPITAL_COMMUNITY)
Admission: EM | Admit: 2023-05-27 | Discharge: 2023-05-27 | Disposition: A | Discharge status: Home / Self Care | Payer: Medicare Other | Attending: Family Medicine | Admitting: Family Medicine

## 2023-05-27 DIAGNOSIS — R1030 Lower abdominal pain, unspecified: Secondary | ICD-10-CM

## 2023-05-27 LAB — POCT URINALYSIS DIP (MANUAL ENTRY)
Bilirubin, UA: NEGATIVE
Blood, UA: NEGATIVE
Glucose, UA: NEGATIVE mg/dL
Ketones, POC UA: NEGATIVE mg/dL
Leukocytes, UA: NEGATIVE
Nitrite, UA: NEGATIVE
Protein Ur, POC: NEGATIVE mg/dL
Spec Grav, UA: 1.02 (ref 1.010–1.025)
Urobilinogen, UA: 0.2 U/dL
pH, UA: 7 (ref 5.0–8.0)

## 2023-05-27 NOTE — ED Provider Notes (Signed)
[facility]    CSN: [id number] Arrival date & time: 05/27/23  1712      History   Chief Complaint Chief Complaint  Patient presents with   Abdominal Pain    HPI Chris Young is a 68 y.o. male.   Patient presents with intermittent abdominal pain, back pain, and occasionally has painful urination for a "few months".  Denies nausea, vomiting, diarrhea, constipation, fever, blood in stool, hematuria, and flank pain.   Abdominal Pain Associated symptoms: dysuria   Associated symptoms: no constipation, no diarrhea, no fever, no hematuria, no nausea and no vomiting     Past Medical History:  Diagnosis Date   Coronary artery disease    Elevated blood pressure    Essential hypertension 03/27/2021   GERD (gastroesophageal reflux disease) 09/04/2014   History of cataract    Hyperlipidemia    Neck pain     Patient Active Problem List   Diagnosis Date Noted   Coronary artery disease involving native coronary artery of native heart without angina pectoris 03/27/2021   Essential hypertension 03/27/2021   GERD (gastroesophageal reflux disease) 09/04/2014    Past Surgical History:  Procedure Laterality Date   EYE SURGERY     cataract       Home Medications    Prior to Admission medications   Medication Sig Start Date End Date Taking? Authorizing Provider  aspirin 81 MG chewable tablet Chew 1 tablet (81 mg total) by mouth daily. 04/27/23  Yes Jake Bathe, MD  atorvastatin (LIPITOR) 40 MG tablet Take 1 tablet (40 mg total) by mouth daily. 04/27/23  Yes Jake Bathe, MD  losartan (COZAAR) 50 MG tablet Take 1 tablet (50 mg total) by mouth daily. 09/21/22 05/27/23 Yes Corky Crafts, MD  meloxicam (MOBIC) 7.5 MG tablet Take 1 tablet (7.5 mg total) by mouth daily as needed for pain. 07/12/22  Yes Zenia Resides, MD  tadalafil (CIALIS) 5 MG tablet Take 5 mg by mouth daily as needed for erectile dysfunction.   Yes [provider]   benzonatate (TESSALON) 100 MG capsule Take 1 capsule (100 mg total) by mouth 3 (three) times daily as needed for cough. Patient not taking: Reported on 11/20/2022 07/12/22   Zenia Resides, MD  metoprolol succinate (TOPROL XL) 25 MG 24 hr tablet Take 1 tablet (25 mg total) by mouth daily. Patient not taking: Reported on 09/21/2022 12/11/21   Elder Negus, MD    Family History Family History  Problem Relation Age of Onset   Stomach cancer Father    Heart disease Brother     Social History Social History   Tobacco Use   Smoking status: Never   Smokeless tobacco: Never  Vaping Use   Vaping status: Never Used  Substance Use Topics   Alcohol use: No    Alcohol/week: 0.0 standard drinks of alcohol   Drug use: No     Allergies   Patient has no known allergies.   Review of Systems Review of Systems  Constitutional:  Negative for fever.  Gastrointestinal:  Positive for abdominal pain. Negative for blood in stool, constipation, diarrhea, nausea and vomiting.  Genitourinary:  Positive for dysuria. Negative for flank pain and hematuria.  Musculoskeletal:  Positive for back pain.     Physical Exam Triage Vital Signs ED Triage Vitals  Encounter Vitals Group     BP 05/27/23 1833 (!) 150/82     Systolic BP Percentile --      Diastolic  BP Percentile --      Pulse Rate 05/27/23 1833 62     Resp 05/27/23 1833 18     Temp 05/27/23 1833 (!) 97.4 F (36.3 C)     Temp src --      SpO2 05/27/23 1833 96 %     Weight --      Height --      Head Circumference --      Peak Flow --      Pain Score 05/27/23 1834 6     Pain Loc --      Pain Education --      Exclude from Growth Chart --    No data found.  Updated Vital Signs BP (!) 150/82   Pulse 62   Temp (!) 97.4 F (36.3 C)   Resp 18   SpO2 96%   Visual Acuity Right Eye Distance:   Left Eye Distance:   Bilateral Distance:    Right Eye Near:   Left Eye Near:    Bilateral Near:     Physical Exam Vitals and  nursing note reviewed.  Constitutional:      General: He is awake. He is not in acute distress.    Appearance: Normal appearance. He is well-developed and well-groomed. He is not ill-appearing.  Cardiovascular:     Rate and Rhythm: Normal rate and regular rhythm.  Pulmonary:     Effort: Pulmonary effort is normal.     Breath sounds: Normal breath sounds.  Abdominal:     General: Abdomen is flat. Bowel sounds are normal.     Palpations: Abdomen is soft.     Tenderness: There is abdominal tenderness in the right lower quadrant, suprapubic area and left lower quadrant. There is no right CVA tenderness or left CVA tenderness.  Skin:    General: Skin is warm and dry.  Neurological:     Mental Status: He is alert.  Psychiatric:        Behavior: Behavior is cooperative.      UC Treatments / Results  Labs (all labs ordered are listed, but only abnormal results are displayed) Labs Reviewed  POCT URINALYSIS DIP (MANUAL ENTRY)    EKG   Radiology No results found.  Procedures Procedures (including critical care time)  Medications Ordered in UC Medications - No data to display  Initial Impression / Assessment and Plan / UC Course  I have reviewed the triage vital signs and the nursing notes.  Pertinent labs & imaging results that were available during my care of the patient were reviewed by me and considered in my medical decision making (see chart for details).     Patient presented with intermittent abdominal pain, back pain, and occasionally has painful urination for a " few months".  Denies any other symptoms.  Upon assessment patient is mildly tender to suprapubic region and right and left lower quadrants.  Without CVA tenderness.  UA did not reveal any signs of infection or abnormalities.  Recommended following up with primary care due to chronic symptoms.  Discussed return and emergency department precautions. Final Clinical Impressions(s) / UC Diagnoses   Final  diagnoses:  Lower abdominal pain     Discharge Instructions      Recommend taking Tylenol and ibuprofen as needed for abdominal pain and back pain.  I suggest you follow-up with your primary care doctor regarding this issue since it has gone on for so long.  Return here as needed.  If you develop severe  abdominal pain, excessive vomiting, or high fever please seek immediate medical treatment in the ER.    ED Prescriptions   None    PDMP not reviewed this encounter.   Wynonia Lawman A, NP 05/27/23 2015

## 2023-05-27 NOTE — Discharge Instructions (Signed)
Recommend taking Tylenol and ibuprofen as needed for abdominal pain and back pain.  I suggest you follow-up with your primary care doctor regarding this issue since it has gone on for so long.  Return here as needed.  If you develop severe abdominal pain, excessive vomiting, or high fever please seek immediate medical treatment in the ER.

## 2023-05-27 NOTE — ED Triage Notes (Signed)
PT reports ABD pain for a long time.

## 2023-09-10 ENCOUNTER — Other Ambulatory Visit: Payer: Self-pay | Admitting: Interventional Cardiology

## 2023-09-23 ENCOUNTER — Ambulatory Visit: Payer: Medicare Other | Attending: Cardiology | Admitting: Cardiology

## 2024-01-06 ENCOUNTER — Ambulatory Visit: Attending: Cardiology | Admitting: Cardiology

## 2024-01-06 ENCOUNTER — Encounter: Payer: Self-pay | Admitting: Cardiology

## 2024-01-06 VITALS — BP 138/72 | HR 70 | Wt 158.0 lb

## 2024-01-06 DIAGNOSIS — I25118 Atherosclerotic heart disease of native coronary artery with other forms of angina pectoris: Secondary | ICD-10-CM | POA: Insufficient documentation

## 2024-01-06 DIAGNOSIS — I10 Essential (primary) hypertension: Secondary | ICD-10-CM | POA: Insufficient documentation

## 2024-01-06 DIAGNOSIS — E782 Mixed hyperlipidemia: Secondary | ICD-10-CM | POA: Diagnosis present

## 2024-01-06 NOTE — Progress Notes (Signed)
 Cardiology Office Note:  .   Date:  01/06/2024  ID:  Chris Young, DOB [birth year], MRN [id number] PCP: Chris Rosina SAILOR, PA  [facility] Providers Cardiologist:  Chris Lawrence, MD PCP: Chris Young, [place]  Chief Complaint  Patient presents with   Chest Pain     Chris Young is a 68 y.o. male with hypertension, hyperlipidemia, GERD, CAD  History of Present Illness  Patient is originally from [place], works in [employer].  He recently returned from a 45-month vacation to [place].  He continues to have chest wall pain that he has had since he was age 62.  He denies any specific exertional chest pain symptoms.  Of note, he did have coronary CT angiogram performed in 2020 22 that did show obstructive CAD.  However, subsequent stress test in 04/2021 showed no ischemia.  He is compliant with his medical therapy, blood pressure generally well-controlled.  He has not had recent labs.     Vitals:   01/06/24 1520  BP: 138/72  Pulse: 70  SpO2: 94%      Review of Systems  Cardiovascular:  Positive for chest pain. Negative for dyspnea on exertion, leg swelling, palpitations and syncope.        Studies Reviewed: SABRA        EKG 01/06/2024: Normal sinus rhythm Normal ECG No previous ECGs available     Lexiscan (Walking with modified Bruce protocol) tetrofosmin stress test 04/14/2021: 1 Day Rest/Stress Protocol. Lexiscan (Walking with mod Bruce)Tetrofosmin Stress Test: Stress ECG equivocal for ischemia - upsloping ST depressions which resolved after 2 minutes into recovery. Normal myocardial perfusion without convincing evidence of reversible myocardial ischemia or prior infarct. Normal LV size and wall thickness. Calculated LVEF 61%.  Low risk study. No prior studies for comparison.  Coronary CT angiogram 11/2020 (performed in [place]): Calcium  score: Total: 716, 75-90th percentile LM: 79 LAD: 473 Lcx: 162 RCA:  2   LM: No  significant stenosis LAD: Proximal LAD calcific 67% stenosis, 12.5 mm from LAD origin Lcx: Proximal 87% calcific stenosis RCA: No significant disease  Labs 09/2022: Chol 113, TG 64, HDL 46, LDL 53 Hb 14.5 Cr 0.9   Physical Exam Vitals and nursing note reviewed.  Constitutional:      General: He is not in acute distress. Neck:     Vascular: No JVD.  Cardiovascular:     Rate and Rhythm: Normal rate and regular rhythm.     Heart sounds: Normal heart sounds. No murmur heard. Pulmonary:     Effort: Pulmonary effort is normal.     Breath sounds: Normal breath sounds. No wheezing or rales.  Musculoskeletal:     Right lower leg: No edema.     Left lower leg: No edema.      VISIT DIAGNOSES:   ICD-10-CM   1. Coronary artery disease of native artery of native heart with stable angina pectoris (HCC)  I25.118 EKG 12-Lead    MYOCARDIAL PERFUSION IMAGING    Lipid panel    Basic metabolic panel with GFR    Cardiac Stress Test: Informed Consent Details: Physician/Practitioner Attestation; Transcribe to consent form and obtain patient signature    2. Essential hypertension  I10     3. Mixed hyperlipidemia  E78.2        Chris Young is a 68 y.o. male with hypertension, hyperlipidemia, GERD, CAD  Assessment & Plan  CAD: Obstructive CAD seen on CT coronary angiogram 11/2020 performed in [place], with moderate  proximal LAD, and severe proximal left circumflex calcific stenosis. However, no ischemia on stress testing (04/2021). He continues to have precordial pain, which is likely musculoskeletal. However, given his known CAD, recommend exercise nuclear stress test to reassess for any ischemia. Continue aspirin , metoprolol  succinate, losartan , Lipitor. Check BMP, lipid panel.  Hypertension: Generally well controlled at home. Continue regular home monitoring. No change made today.    Informed Consent   Shared Decision Making/Informed Consent The risks [chest pain,  shortness of breath, cardiac arrhythmias, dizziness, blood pressure fluctuations, myocardial infarction, stroke/transient ischemic attack, nausea, vomiting, allergic reaction, radiation exposure, metallic taste sensation and life-threatening complications (estimated to be 1 in 10,000)], benefits (risk stratification, diagnosing coronary artery disease, treatment guidance) and alternatives of a nuclear stress test were discussed in detail with Mr. Chris Young and he agrees to proceed.       No orders of the defined types were placed in this encounter.    F/u in 1 year  Signed, Chris JINNY Lawrence, MD

## 2024-01-06 NOTE — Patient Instructions (Signed)
 Lab Work: Bmp Lipid panel   If you have labs (blood work) drawn today and your tests are completely normal, you will receive your results only by: MyChart Message (if you have MyChart) OR A paper copy in the mail If you have any lab test that is abnormal or we need to change your treatment, we will call you to review the results.  Testing/Procedures: Exercise nuclear stress test   Your physician has requested that you have en exercise stress myoview. For further information please visit [email or web address] Please follow instruction sheet, as given.   Follow-Up: At [facility], you and your health needs are our priority.  As part of our continuing mission to provide you with exceptional heart care, our providers are all part of one team.  This team includes your primary Cardiologist (physician) and Advanced Practice Providers or APPs (Physician Assistants and Nurse Practitioners) who all work together to provide you with the care you need, when you need it.  Your next appointment:   1 year(s)  Provider:   Newman JINNY Lawrence, MD    We recommend signing up for the patient portal called MyChart.  Sign up information is provided on this After Visit Summary.  MyChart is used to connect with patients for Virtual Visits (Telemedicine).  Patients are able to view lab/test results, encounter notes, upcoming appointments, etc.  Non-urgent messages can be sent to your provider as well.   To learn more about what you can do with MyChart, go to [email or web address].

## 2024-01-07 ENCOUNTER — Ambulatory Visit: Payer: Self-pay | Admitting: Cardiology

## 2024-01-07 LAB — BASIC METABOLIC PANEL WITH GFR
BUN/Creatinine Ratio: 16 (ref 10–24)
BUN: 15 mg/dL (ref 8–27)
CO2: 24 mmol/L (ref 20–29)
Calcium: 9.7 mg/dL (ref 8.6–10.2)
Chloride: 101 mmol/L (ref 96–106)
Creatinine, Ser: 0.91 mg/dL (ref 0.76–1.27)
Glucose: 87 mg/dL (ref 70–99)
Potassium: 4.3 mmol/L (ref 3.5–5.2)
Sodium: 141 mmol/L (ref 134–144)
eGFR: 92 mL/min/1.73 (ref >59)

## 2024-01-07 LAB — LIPID PANEL
Chol/HDL Ratio: 2.9 ratio (ref 0.0–5.0)
Cholesterol, Total: 126 mg/dL (ref 100–199)
HDL: 43 mg/dL (ref >39)
LDL Chol Calc (NIH): 70 mg/dL (ref 0–99)
Triglycerides: 63 mg/dL (ref 0–149)
VLDL Cholesterol Cal: 13 mg/dL (ref 5–40)

## 2024-01-10 NOTE — Telephone Encounter (Signed)
 Patient returned RN's call regarding results.

## 2024-01-24 ENCOUNTER — Other Ambulatory Visit: Payer: Self-pay | Admitting: Cardiology

## 2024-01-24 DIAGNOSIS — I25118 Atherosclerotic heart disease of native coronary artery with other forms of angina pectoris: Secondary | ICD-10-CM

## 2024-01-26 ENCOUNTER — Encounter (HOSPITAL_COMMUNITY): Payer: Self-pay | Admitting: *Deleted

## 2024-02-03 ENCOUNTER — Ambulatory Visit (HOSPITAL_COMMUNITY)
Admission: RE | Admit: 2024-02-03 | Discharge: 2024-02-03 | Disposition: A | Discharge status: Home / Self Care | Source: Ambulatory Visit | Attending: Cardiology | Admitting: Cardiology

## 2024-02-03 DIAGNOSIS — I25118 Atherosclerotic heart disease of native coronary artery with other forms of angina pectoris: Secondary | ICD-10-CM | POA: Insufficient documentation

## 2024-02-03 LAB — MYOCARDIAL PERFUSION IMAGING
Angina Index: 0
Duke Treadmill Score: 10
Estimated workload: 10.1
Exercise duration (min): 10 min
Exercise duration (sec): 0 s
LV dias vol: 83 mL (ref 62–150)
LV sys vol: 38 mL (ref 4.2–5.8)
MPHR: 153 {beats}/min
Nuc Stress EF: 59 %
Peak HR: 136 {beats}/min
Percent HR: 88 %
Rest HR: 60 {beats}/min
Rest Nuclear Isotope Dose: 10.5 mCi
SDS: 0
SRS: 10
SSS: 2
ST Depression (mm): 0 mm
Stress Nuclear Isotope Dose: 30.6 mCi
TID: 0.92

## 2024-02-03 MED ORDER — TECHNETIUM TC 99M TETROFOSMIN IV KIT
30.9000 | PACK | Freq: Once | INTRAVENOUS | Status: AC | PRN
  Administered 2024-02-03: 30.9 via INTRAVENOUS

## 2024-02-03 MED ORDER — TECHNETIUM TC 99M TETROFOSMIN IV KIT
10.5000 | PACK | Freq: Once | INTRAVENOUS | Status: AC | PRN
  Administered 2024-02-03: 10.5 via INTRAVENOUS

## 2024-02-03 NOTE — Progress Notes (Signed)
 Coronary calcification seen, but no significant heart muscle circulation abnormalities noted on stress test.  Thanks MJP

## 2024-02-06 NOTE — Progress Notes (Signed)
 Unable to leave message will try again at latter time. Used [employer] system to call patient.

## 2024-02-09 NOTE — Progress Notes (Signed)
 Unable to leave message will try again at latter time. Used [employer] system to call patient.

## 2024-02-20 NOTE — Progress Notes (Signed)
 Letter mailed to patient.

## 2024-02-21 ENCOUNTER — Telehealth: Payer: Self-pay | Admitting: Cardiology

## 2024-02-21 NOTE — Telephone Encounter (Signed)
 Pt c/o medication issue:  1. Name of Medication:   metoprolol  succinate (TOPROL  XL) 25 MG 24 hr tablet    2. How are you currently taking this medication (dosage and times per day)? As written  3. Are you having a reaction (difficulty breathing--STAT)? no  4. What is your medication issue? He says it makes his heart rate low

## 2024-02-21 NOTE — Telephone Encounter (Signed)
 Pt was returning nurse call and is requesting a callback. Pt would like a callback after 2pm. Please advise.

## 2024-02-21 NOTE — Telephone Encounter (Signed)
 Left message to call back using language service line.

## 2024-02-22 NOTE — Telephone Encounter (Signed)
Spoke with pt, questions regarding medications answered.

## 2024-02-24 ENCOUNTER — Ambulatory Visit: Admitting: Student

## 2024-03-02 NOTE — Progress Notes (Addendum)
 Cardiology Office Note   Date:  03/09/2024  ID:  Chris Young, DOB 04/02/1956, MRN [id number] PCP: Rosalea Rosina SAILOR, PA  [facility] Providers Cardiologist:  Newman JINNY Lawrence, MD Cardiology APP:  Carlin Delon BROCKS, NP     History of Present Illness Chris Young is a 68 y.o. male with a past medical history of CAD per coronary CT, hypertension, GERD, dyslipidemia, RBBB  02/03/2024 MPI coronary artery calcification noted, normal and low risk study 04/15/2021 MPI normal, low risk 11/26/2020 coronary CT calcium  score of 716, moderate in his proximal LAD and severe in his proximal left circumflex 11/13/2020 echo documentation indicates that was normal  He initially established care with [facility] cardiovascular in 2016 and underwent stress evaluation at that time, was not seen again until around 2022- apparently had been receiving cardiovascular care in [place] in the interim.  Most recently was evaluated on 01/06/2024, he continued to have episodes of chest pain that were persistent since he was a teenager but did not endorse any exertional chest pain, he did have a coronary CT in [place] in 2022 with possible flow restricting lesion however repeat stress evaluation in 2022 revealed no ischemia >> repeat stress evaluation was arranged again noting coronary artery calcification but was an overall normal, low risk study.  He presents today accompanied by medical interpreter however he provides most of his own history.  He has been doing well since he was last evaluated in our office.  He continues to work at [employer], a lot of heavy lifting and standing on his feet and walking for up to 10 hours/day- does not have angina that occurs with this.  He recounts his episodes of chest pain that have been going on since he was approximately 68 years old.  He does receive some of his care in [place] when he returns home, he has a brother who is a teacher, early years/pre as well as an uncle  who is a marine scientist and he is well versed in his health care.  He denies  palpitations, dyspnea, pnd, orthopnea, n, v, dizziness, syncope, edema, weight gain, or early satiety.    ROS: Review of Systems  Cardiovascular:  Positive for chest pain (not consistent with angina).  Musculoskeletal:  Positive for joint pain.  All other systems reviewed and are negative.    Studies Reviewed      Cardiac Studies & Procedures   ______________________________________________________________________________________________   STRESS TESTS  MYOCARDIAL PERFUSION IMAGING 02/03/2024  Interpretation Summary   The study is normal. The study is low risk.   Good exercise capacity, achieved 10.1 METS   Hypertensive response to exercise, peak BP 201/110   Peak heart rate 136 bpm, 88% max age-predicted heart rate   No stress EKG evidence of ischemia   LV perfusion is normal. There is no evidence of ischemia. There is no evidence of infarction.   Left ventricular function is normal. Nuclear stress EF: 59%. The left ventricular ejection fraction is normal (55-65%). End diastolic cavity size is normal. End systolic cavity size is normal.   CT images were obtained for attenuation correction and were examined for the presence of coronary calcium  when appropriate.   Coronary calcium  was present on the attenuation correction CT images. Severe coronary calcifications were present. Coronary calcifications were present in the left anterior descending artery, left circumflex artery and right coronary artery distribution(s).   Prior study available for comparison from 04/15/2021.  ______________________________________________________________________________________________      Risk Assessment/Calculations           Physical Exam VS:  BP 138/78   Pulse 70   Ht 5' 9 (1.753 m)   Wt 163 lb 12.8 oz (74.3 kg)   SpO2 100%   BMI 24.19 kg/m        Wt Readings from Last 3 Encounters:  03/09/24  163 lb 12.8 oz (74.3 kg)  01/06/24 158 lb (71.7 kg)  09/21/22 156 lb 3.2 oz (70.9 kg)    GEN: Well nourished, well developed in no acute distress NECK: No JVD; No carotid bruits CARDIAC: RRR, no murmurs, rubs, gallops RESPIRATORY:  Clear to auscultation without rales, wheezing or rhonchi  ABDOMEN: Soft, non-tender, non-distended EXTREMITIES:  No edema; No deformity   ASSESSMENT AND PLAN CAD -possible flow-limiting lesion on CT scan completed in [place] in 2022.  A trial of Imdur  had been attempted however he was unable to tolerate this secondary to headache.  He continues to have episodes of chest pain but they are not consistent with angina.  He does state if he has an episode he is typically able to pray, his symptoms are alleviated.  He works a very physically active job and this does not exacerbate his pain.  Continue aspirin  81 mg daily, Lipitor 40 mg daily, metoprolol  25 mg daily.  There is no indication for an ischemic evaluation at this time as he just had a good stress evaluation in September.  Hypertension-blood pressure is slightly elevated today however better upon recheck at 138/78, continue Cozaar  50 mg daily, metoprolol  25 mg daily, he prefers a simplified medication regimen and is not really interested in making any adjustments to his medications or plan of care at this time.  Dyslipidemia-most recent LDL was controlled at 70, currently on Lipitor 40 mg daily which we will continue and he is tolerating without any adverse effects.          Dispo: Follow-up in 1 year with Dr. Elmira or myself.  Signed, Delon JAYSON Hoover, NP

## 2024-03-06 ENCOUNTER — Ambulatory Visit: Admitting: Cardiology

## 2024-03-09 ENCOUNTER — Encounter: Payer: Self-pay | Admitting: Cardiology

## 2024-03-09 ENCOUNTER — Ambulatory Visit: Attending: Cardiology | Admitting: Cardiology

## 2024-03-09 VITALS — BP 138/78 | HR 70 | Ht 69.0 in | Wt 163.8 lb

## 2024-03-09 DIAGNOSIS — I10 Essential (primary) hypertension: Secondary | ICD-10-CM | POA: Diagnosis not present

## 2024-03-09 DIAGNOSIS — I25118 Atherosclerotic heart disease of native coronary artery with other forms of angina pectoris: Secondary | ICD-10-CM | POA: Diagnosis not present

## 2024-03-09 DIAGNOSIS — E782 Mixed hyperlipidemia: Secondary | ICD-10-CM

## 2024-03-09 NOTE — Patient Instructions (Signed)
 Thank you for choosing [facility]!     Medication Instructions:  No medication changes were made during today's visit.  *If you need a refill on your cardiac medications before your next appointment, please call your pharmacy*   Lab Work: No labs were ordered during today's visit.  If you have labs (blood work) drawn today and your tests are completely normal, you will receive your results only by: MyChart Message (if you have MyChart) OR A paper copy in the mail If you have any lab test that is abnormal or we need to change your treatment, we will call you to review the results.   Testing/Procedures: No procedures were ordered during today's visit.   Your next appointment:   1 year(s)   Provider:   Newman JINNY Lawrence, MD     Follow-Up: At [facility], you and your health needs are our priority.  As part of our continuing mission to provide you with exceptional heart care, we have created designated Provider Care Teams.  These Care Teams include your primary Cardiologist (physician) and Advanced Practice Providers (APPs -  Physician Assistants and Nurse Practitioners) who all work together to provide you with the care you need, when you need it. We recommend signing up for the patient portal called MyChart.  Sign up information is provided on this After Visit Summary.  MyChart is used to connect with patients for Virtual Visits (Telemedicine).  Patients are able to view lab/test results, encounter notes, upcoming appointments, etc.  Non-urgent messages can be sent to your provider as well.   To learn more about what you can do with MyChart, go to [email or web address].
